# Patient Record
Sex: Male | Born: 1995 | Race: Black or African American | Hispanic: No | Marital: Married | Smoking: Never smoker
Health system: Southern US, Community
[De-identification: ages and names within clinical notes are randomized; demographics above are authoritative.]

## PROBLEM LIST (undated history)

## (undated) DIAGNOSIS — Z789 Other specified health status: Secondary | ICD-10-CM

---
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
## 2018-06-12 ENCOUNTER — Encounter (HOSPITAL_COMMUNITY): Payer: Self-pay | Admitting: Emergency Medicine

## 2018-06-12 ENCOUNTER — Ambulatory Visit (HOSPITAL_COMMUNITY)
Admission: EM | Admit: 2018-06-12 | Discharge: 2018-06-12 | Disposition: A | Discharge status: Home / Self Care | Payer: Federal, State, Local not specified - PPO | Attending: Internal Medicine | Admitting: Internal Medicine

## 2018-06-12 ENCOUNTER — Other Ambulatory Visit: Payer: Self-pay

## 2018-06-12 DIAGNOSIS — R42 Dizziness and giddiness: Secondary | ICD-10-CM

## 2018-06-12 DIAGNOSIS — R61 Generalized hyperhidrosis: Secondary | ICD-10-CM | POA: Diagnosis not present

## 2018-06-12 DIAGNOSIS — J029 Acute pharyngitis, unspecified: Secondary | ICD-10-CM | POA: Insufficient documentation

## 2018-06-12 LAB — POCT INFECTIOUS MONO SCREEN: Mono Screen: NEGATIVE

## 2018-06-12 LAB — POCT RAPID STREP A: Streptococcus, Group A Screen (Direct): NEGATIVE

## 2018-06-12 MED ORDER — CETIRIZINE HCL 10 MG PO CAPS: 10.0000 mg | ORAL_CAPSULE | Freq: Every day | ORAL | 0 refills | Status: DC

## 2018-06-12 MED ORDER — IBUPROFEN 800 MG PO TABS: 800.0000 mg | ORAL_TABLET | Freq: Three times a day (TID) | ORAL | 0 refills | Status: DC

## 2018-06-12 NOTE — Discharge Instructions (Signed)
Sore Throat  Your rapid strep tested Negative today as well as mono. We will send for a culture and call in about 2 days if results are positive. For now we will treat your sore throat as a virus with symptom management.   Please continue Tylenol or Ibuprofen for fever and pain. May try salt water gargles, cepacol lozenges, throat spray, or OTC cold relief medicine for throat discomfort. If you also have congestion take a daily anti-histamine like Zyrtec, Claritin, and a oral decongestant to help with post nasal drip that may be irritating your throat.   Stay hydrated and drink plenty of fluids to keep your throat coated relieve irritation.

## 2018-06-12 NOTE — ED Provider Notes (Signed)
[facility]    CSN: [id number] Arrival date & time: 06/12/18  1000     History   Chief Complaint Chief Complaint  Patient presents with  . Sore Throat    HPI Joshua Holmes is a 22 y.o. male there is no significant past medical history presenting today for evaluation of sore throat.  States he has had a sore throat for approximately 1 week, states that this is been mild until yesterday.  Sore throat worsened yesterday.  Has also noticed the taste of blood in the back of his throat.  He denies associated URI symptoms of congestion, rhinorrhea, coughing.  Is endorsing significant fatigue and decreased appetite.  He has been using Tylenol and throat spray with mild relief.  Denies nausea or vomiting.  He is unsure of fevers, but notes that he has had cold sweats/night sweats.  HPI  History reviewed. No pertinent past medical history.  There are no active problems to display for this patient.   History reviewed. No pertinent surgical history.     Home Medications    Prior to Admission medications   Medication Sig Start Date End Date Taking? Authorizing Provider  acetaminophen (TYLENOL) 500 MG tablet Take 1,000 mg by mouth every 6 (six) hours as needed for moderate pain, fever or headache.    Yes [provider]  Cetirizine HCl 10 MG CAPS Take 1 capsule (10 mg total) by mouth daily for 10 days. 06/12/18 06/22/18  Rilley Stash C, PA-C  ibuprofen (ADVIL,MOTRIN) 800 MG tablet Take 1 tablet (800 mg total) by mouth 3 (three) times daily. 06/12/18   Jovann Luse, Junius CreamerHallie C, PA-C    Family History Family History  Problem Relation Age of Onset  . Healthy Mother   . Healthy Father     Social History Social History   Tobacco Use  . Smoking status: Never Smoker  Substance Use Topics  . Alcohol use: Yes  . Drug use: Yes    Types: Marijuana     Allergies   Patient has no known allergies.   Review of Systems Review of Systems  Constitutional: Positive for  chills, fatigue and fever. Negative for activity change and appetite change.  HENT: Positive for sore throat. Negative for congestion, ear pain, rhinorrhea, sinus pressure and trouble swallowing.   Eyes: Negative for discharge and redness.  Respiratory: Negative for cough, chest tightness and shortness of breath.   Cardiovascular: Negative for chest pain.  Gastrointestinal: Negative for abdominal pain, diarrhea, nausea and vomiting.  Musculoskeletal: Negative for myalgias.  Skin: Negative for rash.  Neurological: Positive for light-headedness. Negative for dizziness and headaches.     Physical Exam Triage Vital Signs ED Triage Vitals  Enc Vitals Group     BP 06/12/18 1015 134/80     Pulse Rate 06/12/18 1015 82     Resp 06/12/18 1015 18     Temp 06/12/18 1015 98.1 F (36.7 C)     Temp Source 06/12/18 1015 Oral     SpO2 06/12/18 1015 99 %     Weight --      Height --      Head Circumference --      Peak Flow --      Pain Score 06/12/18 1013 8     Pain Loc --      Pain Edu? --      Excl. in GC? --    No data found.  Updated Vital Signs BP 134/80 (BP Location: Left Arm)  Pulse 82   Temp 98.1 F (36.7 C) (Oral)   Resp 18   SpO2 99%   Visual Acuity Right Eye Distance:   Left Eye Distance:   Bilateral Distance:    Right Eye Near:   Left Eye Near:    Bilateral Near:     Physical Exam  Constitutional: He appears well-developed and well-nourished.  HENT:  Head: Normocephalic and atraumatic.  Bilateral ears without tenderness to palpation of external auricle, tragus and mastoid, EAC's without erythema or swelling, TM's with good bony landmarks and cone of light. Non erythematous.  Oral mucosa pink and moist, mild tonsillar enlargement with erythema, slight white film appearance of her tonsils. Posterior pharynx patent and nonerythematous, no uvula deviation or swelling. Normal phonation.   Eyes: Conjunctivae are normal.  Neck: Neck supple.  Cardiovascular: Normal  rate and regular rhythm.  No murmur heard. Pulmonary/Chest: Effort normal and breath sounds normal. No respiratory distress.  Breathing comfortably at rest, CTABL, no wheezing, rales or other adventitious sounds auscultated  Abdominal: Soft. There is no tenderness.  Musculoskeletal: He exhibits no edema.  Neurological: He is alert.  Skin: Skin is warm and dry.  Psychiatric: He has a normal mood and affect.  Nursing note and vitals reviewed.    UC Treatments / Results  Labs (all labs ordered are listed, but only abnormal results are displayed) Labs Reviewed  CULTURE, GROUP A STREP [facility])  POCT RAPID STREP A  POCT INFECTIOUS MONO SCREEN    EKG None  Radiology No results found.  Procedures Procedures (including critical care time)  Medications Ordered in UC Medications - No data to display  Initial Impression / Assessment and Plan / UC Course  I have reviewed the triage vital signs and the nursing notes.  Pertinent labs & imaging results that were available during my care of the patient were reviewed by me and considered in my medical decision making (see chart for details).     Strep test negative, mono negative.  Likely viral versus postnasal drainage.  Discussed OTC measures to further control symptoms.Discussed strict return precautions. Patient verbalized understanding and is agreeable with plan.  Final Clinical Impressions(s) / UC Diagnoses   Final diagnoses:  Sore throat     Discharge Instructions     Sore Throat  Your rapid strep tested Negative today as well as mono. We will send for a culture and call in about 2 days if results are positive. For now we will treat your sore throat as a virus with symptom management.   Please continue Tylenol or Ibuprofen for fever and pain. May try salt water gargles, cepacol lozenges, throat spray, or OTC cold relief medicine for throat discomfort. If you also have congestion take a daily anti-histamine like Zyrtec,  Claritin, and a oral decongestant to help with post nasal drip that may be irritating your throat.   Stay hydrated and drink plenty of fluids to keep your throat coated relieve irritation.     ED Prescriptions    Medication Sig Dispense Auth. Provider   ibuprofen (ADVIL,MOTRIN) 800 MG tablet Take 1 tablet (800 mg total) by mouth 3 (three) times daily. 21 tablet Lin Hackmann C, PA-C   Cetirizine HCl 10 MG CAPS Take 1 capsule (10 mg total) by mouth daily for 10 days. 15 capsule Lakeishia Truluck C, PA-C     Controlled Substance Prescriptions [place] Controlled Substance Registry consulted? Not Applicable   Lew Dawes, [place] 06/12/18 1121

## 2018-06-12 NOTE — ED Triage Notes (Signed)
Sore throat x 1 week.  Patient says he tastes blood and spits bits of blood when brushing his teeth.  Patient having cold sweats.

## 2018-06-14 LAB — CULTURE, GROUP A STREP (THRC)

## 2019-06-28 ENCOUNTER — Other Ambulatory Visit: Payer: Self-pay

## 2019-06-28 DIAGNOSIS — Z20822 Contact with and (suspected) exposure to covid-19: Secondary | ICD-10-CM

## 2019-06-29 ENCOUNTER — Other Ambulatory Visit: Payer: Self-pay

## 2019-06-29 ENCOUNTER — Ambulatory Visit (HOSPITAL_COMMUNITY)
Admission: EM | Admit: 2019-06-29 | Discharge: 2019-06-29 | Disposition: A | Discharge status: Home / Self Care | Payer: Federal, State, Local not specified - PPO | Attending: Emergency Medicine | Admitting: Emergency Medicine

## 2019-06-29 ENCOUNTER — Encounter (HOSPITAL_COMMUNITY): Payer: Self-pay

## 2019-06-29 DIAGNOSIS — R509 Fever, unspecified: Secondary | ICD-10-CM | POA: Diagnosis present

## 2019-06-29 DIAGNOSIS — R112 Nausea with vomiting, unspecified: Secondary | ICD-10-CM | POA: Diagnosis present

## 2019-06-29 DIAGNOSIS — Z20828 Contact with and (suspected) exposure to other viral communicable diseases: Secondary | ICD-10-CM | POA: Insufficient documentation

## 2019-06-29 DIAGNOSIS — R1010 Upper abdominal pain, unspecified: Secondary | ICD-10-CM

## 2019-06-29 DIAGNOSIS — R1084 Generalized abdominal pain: Secondary | ICD-10-CM | POA: Diagnosis present

## 2019-06-29 DIAGNOSIS — R05 Cough: Secondary | ICD-10-CM | POA: Insufficient documentation

## 2019-06-29 DIAGNOSIS — R059 Cough, unspecified: Secondary | ICD-10-CM

## 2019-06-29 MED ORDER — TRAMADOL HCL 50 MG PO TABS: 50.0000 mg | ORAL_TABLET | Freq: Four times a day (QID) | ORAL | 0 refills | Status: AC | PRN

## 2019-06-29 MED ORDER — ONDANSETRON 4 MG PO TBDP
ORAL_TABLET | ORAL | Status: AC
  Filled 2019-06-29: qty 2

## 2019-06-29 MED ORDER — ACETAMINOPHEN 325 MG PO TABS
650.0000 mg | ORAL_TABLET | Freq: Once | ORAL | Status: AC
  Administered 2019-06-29: 650 mg via ORAL

## 2019-06-29 MED ORDER — ACETAMINOPHEN 325 MG PO TABS
ORAL_TABLET | ORAL | Status: AC
  Filled 2019-06-29: qty 2

## 2019-06-29 MED ORDER — ONDANSETRON 4 MG PO TBDP
8.0000 mg | ORAL_TABLET | Freq: Once | ORAL | Status: AC
  Administered 2019-06-29: 18:00:00 8 mg via ORAL

## 2019-06-29 MED ORDER — ONDANSETRON 8 MG PO TBDP: 8.0000 mg | ORAL_TABLET | Freq: Three times a day (TID) | ORAL | 0 refills | Status: DC | PRN

## 2019-06-29 NOTE — ED Notes (Addendum)
Called for room, no answer

## 2019-06-29 NOTE — Discharge Instructions (Signed)
Your COVID-19 testing is pending.  It is also possible that you could have a viral stomach infection or possibly appendicitis.  We will use supportive care including the antinausea medicine and pain medicine, tramadol.  Please continue to use Tylenol for fever and aches at a dose of 500 mg once every 6 hours.  If your pain worsens especially over the right lower side of your belly then please report to the emergency room immediately as this could be a sign of appendicitis.  Otherwise quarantine at home and we will keep you posted on your test results.

## 2019-06-29 NOTE — ED Triage Notes (Signed)
Pt states she has been feeling sick with nausea and abdominal pains.x 3 days or more.

## 2019-06-29 NOTE — ED Provider Notes (Signed)
MRN: [id number] DOB: 07-Nov-1996  Subjective:   Joshua Holmes is a 23 y.o. male presenting for 4 day history of acute onset worsening mostly intermittent upper abdominal pain that is a burning type sensation. Has also had decreased appetite. Has tried APAP without much relief. No COVID contacts to best of his knowledge. Denies smoking cigarettes.  He is not currently taking any medications and has no known food or drug allergies.  Denies past medical and surgical history.   Review of Systems  Constitutional: Positive for fever. Negative for malaise/fatigue.  HENT: Negative for congestion, ear pain, sinus pain and sore throat.   Eyes: Negative for blurred vision, double vision, discharge and redness.  Respiratory: Positive for cough. Negative for hemoptysis, shortness of breath and wheezing.   Cardiovascular: Negative for chest pain.  Gastrointestinal: Positive for abdominal pain, nausea and vomiting. Negative for blood in stool, constipation and diarrhea.  Genitourinary: Negative for dysuria, flank pain and hematuria.  Musculoskeletal: Positive for myalgias.  Skin: Negative for rash.  Neurological: Negative for dizziness, weakness and headaches.  Psychiatric/Behavioral: Negative for depression and substance abuse.    Objective:   Vitals: BP (!) 141/77 (BP Location: Right Arm)   Pulse (!) 114   Temp (!) 102 F (38.9 C)   Resp 18   Wt 16 lb (7.258 kg)   SpO2 100%   BMI 2.17 kg/m   Pulse was 104 on recheck by PA-Lissandro Dilorenzo.  Physical Exam Constitutional:      General: He is not in acute distress.    Appearance: Normal appearance. He is well-developed and normal weight. He is not ill-appearing, toxic-appearing or diaphoretic.  HENT:     Head: Normocephalic and atraumatic.     Right Ear: External ear normal.     Left Ear: External ear normal.     Nose: Nose normal.     Mouth/Throat:     Mouth: Mucous membranes are moist.     Pharynx: Oropharynx is clear.  Eyes:     General: No  scleral icterus.    Extraocular Movements: Extraocular movements intact.     Pupils: Pupils are equal, round, and reactive to light.  Cardiovascular:     Rate and Rhythm: Normal rate and regular rhythm.     Heart sounds: Normal heart sounds. No murmur. No friction rub. No gallop.   Pulmonary:     Effort: Pulmonary effort is normal. No respiratory distress.     Breath sounds: Normal breath sounds. No stridor. No wheezing, rhonchi or rales.  Abdominal:     General: Bowel sounds are normal. There is no distension.     Palpations: Abdomen is soft. There is no mass.     Tenderness: There is generalized abdominal tenderness and tenderness in the right lower quadrant, epigastric area and periumbilical area. There is no right CVA tenderness, left CVA tenderness, guarding or rebound.     Hernia: No hernia is present.  Skin:    General: Skin is warm and dry.  Neurological:     Mental Status: He is alert and oriented to person, place, and time.  Psychiatric:        Mood and Affect: Mood normal.        Behavior: Behavior normal.        Thought Content: Thought content normal.     Assessment and Plan :   1. Pain of upper abdomen   2. Fever, unspecified   3. Cough   4. Nausea and vomiting, intractability of vomiting not  specified, unspecified vomiting type   5. Generalized abdominal pain     Labs pending, counseled patient on differential which includes COVID-19, viral gastroenteritis, appendicitis.  Recommended patient use supportive care and counseled extensively on warning signs and symptoms of developing appendicitis.  Strict ER precautions. Counseled patient on potential for adverse effects with medications prescribed today, patient verbalized understanding.     Wallis BambergMani, Cherise Fedder, [place] 06/29/19 1753

## 2019-06-30 ENCOUNTER — Emergency Department (HOSPITAL_COMMUNITY): Payer: Federal, State, Local not specified - PPO

## 2019-06-30 ENCOUNTER — Other Ambulatory Visit: Payer: Self-pay

## 2019-06-30 ENCOUNTER — Encounter (HOSPITAL_COMMUNITY): Payer: Self-pay

## 2019-06-30 ENCOUNTER — Emergency Department (HOSPITAL_COMMUNITY)
Admission: EM | Admit: 2019-06-30 | Discharge: 2019-06-30 | Disposition: A | Discharge status: Home / Self Care | Payer: Federal, State, Local not specified - PPO | Attending: Emergency Medicine | Admitting: Emergency Medicine

## 2019-06-30 DIAGNOSIS — R1031 Right lower quadrant pain: Secondary | ICD-10-CM | POA: Diagnosis present

## 2019-06-30 LAB — CBC WITH DIFFERENTIAL/PLATELET
Abs Immature Granulocytes: 0.07 10*3/uL (ref 0.00–0.07)
Basophils Absolute: 0.1 10*3/uL (ref 0.0–0.1)
Basophils Relative: 0 %
Eosinophils Absolute: 0 10*3/uL (ref 0.0–0.5)
Eosinophils Relative: 0 %
HCT: 43.3 % (ref 39.0–52.0)
Hemoglobin: 14.8 g/dL (ref 13.0–17.0)
Immature Granulocytes: 0 %
Lymphocytes Relative: 11 %
Lymphs Abs: 1.7 10*3/uL (ref 0.7–4.0)
MCH: 30.3 pg (ref 26.0–34.0)
MCHC: 34.2 g/dL (ref 30.0–36.0)
MCV: 88.7 fL (ref 80.0–100.0)
Monocytes Absolute: 2.3 10*3/uL — ABNORMAL HIGH (ref 0.1–1.0)
Monocytes Relative: 14 %
Neutro Abs: 11.8 10*3/uL — ABNORMAL HIGH (ref 1.7–7.7)
Neutrophils Relative %: 75 %
Platelets: 171 10*3/uL (ref 150–400)
RBC: 4.88 MIL/uL (ref 4.22–5.81)
RDW: 13 % (ref 11.5–15.5)
WBC: 15.9 10*3/uL — ABNORMAL HIGH (ref 4.0–10.5)
nRBC: 0 % (ref 0.0–0.2)

## 2019-06-30 LAB — COMPREHENSIVE METABOLIC PANEL
ALT: 16 U/L (ref 0–44)
AST: 18 U/L (ref 15–41)
Albumin: 3.6 g/dL (ref 3.5–5.0)
Alkaline Phosphatase: 77 U/L (ref 38–126)
Anion gap: 13 (ref 5–15)
BUN: 10 mg/dL (ref 6–20)
CO2: 24 mmol/L (ref 22–32)
Calcium: 9.1 mg/dL (ref 8.9–10.3)
Chloride: 96 mmol/L — ABNORMAL LOW (ref 98–111)
Creatinine, Ser: 0.73 mg/dL (ref 0.61–1.24)
GFR calc Af Amer: >60 mL/min (ref >60)
GFR calc non Af Amer: >60 mL/min (ref >60)
Glucose, Bld: 125 mg/dL — ABNORMAL HIGH (ref 70–99)
Potassium: 3.4 mmol/L — ABNORMAL LOW (ref 3.5–5.1)
Sodium: 133 mmol/L — ABNORMAL LOW (ref 135–145)
Total Bilirubin: 1.4 mg/dL — ABNORMAL HIGH (ref 0.3–1.2)
Total Protein: 8.1 g/dL (ref 6.5–8.1)

## 2019-06-30 LAB — NOVEL CORONAVIRUS, NAA: SARS-CoV-2, NAA: NOT DETECTED

## 2019-06-30 LAB — LIPASE, BLOOD: Lipase: 22 U/L (ref 11–51)

## 2019-06-30 MED ORDER — SODIUM CHLORIDE (PF) 0.9 % IJ SOLN
INTRAMUSCULAR | Status: AC
  Filled 2019-06-30: qty 50

## 2019-06-30 MED ORDER — MORPHINE SULFATE (PF) 4 MG/ML IV SOLN
4.0000 mg | Freq: Once | INTRAVENOUS | Status: AC
  Administered 2019-06-30: 4 mg via INTRAVENOUS
  Filled 2019-06-30: qty 1

## 2019-06-30 MED ORDER — SODIUM CHLORIDE 0.9 % IV BOLUS
1000.0000 mL | Freq: Once | INTRAVENOUS | Status: AC
  Administered 2019-06-30: 1000 mL via INTRAVENOUS

## 2019-06-30 MED ORDER — ONDANSETRON HCL 4 MG/2ML IJ SOLN: 4.0000 mg | Freq: Once | INTRAMUSCULAR | Status: DC

## 2019-06-30 MED ORDER — PROMETHAZINE HCL 25 MG RE SUPP: 25.0000 mg | Freq: Four times a day (QID) | RECTAL | 0 refills | Status: DC | PRN

## 2019-06-30 MED ORDER — IOHEXOL 300 MG/ML  SOLN
100.0000 mL | Freq: Once | INTRAMUSCULAR | Status: AC | PRN
  Administered 2019-06-30: 100 mL via INTRAVENOUS

## 2019-06-30 MED ORDER — ONDANSETRON HCL 4 MG/2ML IJ SOLN
4.0000 mg | Freq: Once | INTRAMUSCULAR | Status: AC
  Administered 2019-06-30: 16:00:00 4 mg via INTRAVENOUS
  Filled 2019-06-30: qty 2

## 2019-06-30 NOTE — Discharge Instructions (Signed)
Try a bland diet.  Bananas rice applesauce and toast were typical.  Please return for worsening abdominal pain inability to eat or drink.  Use the suppositories as needed for nausea and vomiting.

## 2019-06-30 NOTE — ED Triage Notes (Signed)
Patient c/o RLQ abdominal pain and nausea x 4 days ago that is worsening.

## 2019-06-30 NOTE — ED Provider Notes (Signed)
[facility]EMERGENCY DEPT Provider Note   CSN: [id number] Arrival date & time: 06/30/19  1315    History   Chief Complaint Chief Complaint  Patient presents with  . Abdominal Pain  . Emesis    HPI Joshua Holmes is a 23 y.o. male.     23 yo M with a cc of RLQ abdominal pain.  Going on for the past 4 days.  Pain coming and going.  Worsening now with fever. Nausea and vomiting.  No diarrhea or constipation.  No history of prior abdominal surgery.  No penile or testicular pain.    The history is provided by the patient.  Abdominal Pain Pain location:  RLQ Pain quality: sharp and shooting   Pain radiates to:  Does not radiate Pain severity:  Moderate Onset quality:  Gradual Duration:  4 days Timing:  Constant Progression:  Worsening Chronicity:  New Relieved by:  Nothing Worsened by:  Nothing Ineffective treatments:  None tried Associated symptoms: vomiting   Associated symptoms: no chest pain, no chills, no diarrhea, no fever and no shortness of breath   Emesis Associated symptoms: abdominal pain   Associated symptoms: no arthralgias, no chills, no diarrhea, no fever, no headaches and no myalgias     History reviewed. No pertinent past medical history.  There are no active problems to display for this patient.   History reviewed. No pertinent surgical history.      Home Medications    Prior to Admission medications   Medication Sig Start Date End Date Taking? Authorizing Provider  acetaminophen (TYLENOL) 500 MG tablet Take 1,000 mg by mouth every 6 (six) hours as needed for moderate pain, fever or headache.    Yes [provider]  ondansetron (ZOFRAN-ODT) 8 MG disintegrating tablet Take 1 tablet (8 mg total) by mouth every 8 (eight) hours as needed for nausea or vomiting. 06/29/19  Yes Wallis BambergMani, Mario, PA-C  traMADol (ULTRAM) 50 MG tablet Take 1 tablet (50 mg total) by mouth every 6 (six) hours as needed for up to 7 days for severe  pain. 06/29/19 07/06/19 Yes Wallis BambergMani, Mario, PA-C  Cetirizine HCl 10 MG CAPS Take 1 capsule (10 mg total) by mouth daily for 10 days. Patient not taking: Reported on 06/30/2019 06/12/18 06/22/18  Wieters, Hallie C, PA-C  ibuprofen (ADVIL,MOTRIN) 800 MG tablet Take 1 tablet (800 mg total) by mouth 3 (three) times daily. Patient not taking: Reported on 06/30/2019 06/12/18   Wieters, Fran LowesHallie C, PA-C  promethazine (PHENERGAN) 25 MG suppository Place 1 suppository (25 mg total) rectally every 6 (six) hours as needed for nausea or vomiting. 06/30/19   Melene PlanFloyd, Rasheida Broden, DO    Family History Family History  Problem Relation Age of Onset  . Healthy Mother   . Healthy Father     Social History Social History   Tobacco Use  . Smoking status: Never Smoker  . Smokeless tobacco: Never Used  Substance Use Topics  . Alcohol use: Yes  . Drug use: Yes    Types: Marijuana     Allergies   Patient has no known allergies.   Review of Systems Review of Systems  Constitutional: Negative for chills and fever.  HENT: Negative for congestion and facial swelling.   Eyes: Negative for discharge and visual disturbance.  Respiratory: Negative for shortness of breath.   Cardiovascular: Negative for chest pain and palpitations.  Gastrointestinal: Positive for abdominal pain and vomiting. Negative for diarrhea.  Musculoskeletal: Negative for arthralgias and myalgias.  Skin: Negative for color change and rash.  Neurological: Negative for tremors, syncope and headaches.  Psychiatric/Behavioral: Negative for confusion and dysphoric mood.     Physical Exam Updated Vital Signs BP 125/64   Pulse 97   Temp 99.5 F (37.5 C) (Oral)   Resp 16   Ht 6' (1.829 m)   Wt 70.7 kg   SpO2 100%   BMI 21.13 kg/m   Physical Exam Vitals signs and nursing note reviewed.  Constitutional:      Appearance: He is well-developed.  HENT:     Head: Normocephalic and atraumatic.  Eyes:     Pupils: Pupils are equal, round, and reactive  to light.  Neck:     Musculoskeletal: Normal range of motion and neck supple.     Vascular: No JVD.  Cardiovascular:     Rate and Rhythm: Normal rate and regular rhythm.     Heart sounds: No murmur. No friction rub. No gallop.   Pulmonary:     Effort: No respiratory distress.     Breath sounds: No wheezing.  Abdominal:     General: There is no distension.     Tenderness: There is abdominal tenderness. There is no guarding or rebound. Positive signs include Rovsing's sign and McBurney's sign.  Musculoskeletal: Normal range of motion.  Skin:    Coloration: Skin is not pale.     Findings: No rash.  Neurological:     Mental Status: He is alert and oriented to person, place, and time.  Psychiatric:        Behavior: Behavior normal.      ED Treatments / Results  Labs (all labs ordered are listed, but only abnormal results are displayed) Labs Reviewed  CBC WITH DIFFERENTIAL/PLATELET - Abnormal; Notable for the following components:      Result Value   WBC 15.9 (*)    Neutro Abs 11.8 (*)    Monocytes Absolute 2.3 (*)    All other components within normal limits  COMPREHENSIVE METABOLIC PANEL - Abnormal; Notable for the following components:   Sodium 133 (*)    Potassium 3.4 (*)    Chloride 96 (*)    Glucose, Bld 125 (*)    Total Bilirubin 1.4 (*)    All other components within normal limits  LIPASE, BLOOD    EKG None  Radiology Ct Abdomen Pelvis W Contrast  Result Date: 06/30/2019 CLINICAL DATA:  Right lower quadrant abdominal pain and nausea for 4 days. Symptoms have been progressing. Abdominal pain, appendicitis suspected. EXAM: CT ABDOMEN AND PELVIS WITH CONTRAST TECHNIQUE: Multidetector CT imaging of the abdomen and pelvis was performed using the standard protocol following bolus administration of intravenous contrast. CONTRAST:  100mL OMNIPAQUE IOHEXOL 300 MG/ML  SOLN COMPARISON:  None. FINDINGS: Lower chest: Lung bases are clear without focal nodule, mass, or airspace  disease. The heart size is normal. No significant pleural or pericardial effusion is present. Hepatobiliary: No focal liver abnormality is seen. No gallstones, gallbladder wall thickening, a 5 mm hypodense lesion near the dome of the liver likely represents a simple cyst. Other focal hepatic lesion is present. There is no gallstone or gallbladder wall thickening. There is no biliary dilation. Pancreas: Unremarkable. No pancreatic ductal dilatation or surrounding inflammatory changes. Spleen: Normal in size without focal abnormality. Adrenals/Urinary Tract: Adrenal glands are within normal limits. Contrast is noted in the calices. This may be due to an early contrast injection. There is no stone or mass lesion. Ureters are within normal limits. The  urinary bladder is normal. Stomach/Bowel: Stomach and duodenum are within normal limits. Small bowel is unremarkable. Terminal ileum is unremarkable. Air-filled appendix is present posteriorly and a transverse plane. The ascending and transverse colon are within normal limits. Descending and sigmoid colon are normal. Vascular/Lymphatic: No significant vascular findings are present. No enlarged abdominal or pelvic lymph nodes. Reproductive: Prostate is unremarkable. Other: No abdominal wall hernia or abnormality. No abdominopelvic ascites. Musculoskeletal: Vertebral body heights and alignment are maintained. No focal lytic or blastic lesions are present. Hips are located and within normal limits. IMPRESSION: 1. Normal CT of the abdomen pelvis. No acute or focal lesion to explain the patient's pain. 2. Normal CT appearance of the appendix. Electronically Signed   By: San Morelle M.D.   On: 06/30/2019 18:10    Procedures Procedures (including critical care time)  Medications Ordered in ED Medications  sodium chloride 0.9 % bolus 1,000 mL (0 mLs Intravenous Stopped 06/30/19 1845)  ondansetron (ZOFRAN) injection 4 mg (4 mg Intravenous Given 06/30/19 1556)   morphine 4 MG/ML injection 4 mg (4 mg Intravenous Given 06/30/19 1618)  iohexol (OMNIPAQUE) 300 MG/ML solution 100 mL (100 mLs Intravenous Contrast Given 06/30/19 1743)     Initial Impression / Assessment and Plan / ED Course  I have reviewed the triage vital signs and the nursing notes.  Pertinent labs & imaging results that were available during my care of the patient were reviewed by me and considered in my medical decision making (see chart for details).        23 yo M with a cc of RLQ abdominal pain.  Going on for 4-5 days, coming and going now with fever.  Concern for appendicitis, pain and fluid meds, CT.   CT negative. Feeling better on reassessment.  Tolerating PO.   10:51 PM:  I have discussed the diagnosis/risks/treatment options with the patient and believe the pt to be eligible for discharge home to follow-up with PCP. We also discussed returning to the ED immediately if new or worsening sx occur. We discussed the sx which are most concerning (e.g., sudden worsening pain, fever, inability to tolerate by mouth) that necessitate immediate return. Medications administered to the patient during their visit and any new prescriptions provided to the patient are listed below.  Medications given during this visit Medications  sodium chloride 0.9 % bolus 1,000 mL (0 mLs Intravenous Stopped 06/30/19 1845)  ondansetron (ZOFRAN) injection 4 mg (4 mg Intravenous Given 06/30/19 1556)  morphine 4 MG/ML injection 4 mg (4 mg Intravenous Given 06/30/19 1618)  iohexol (OMNIPAQUE) 300 MG/ML solution 100 mL (100 mLs Intravenous Contrast Given 06/30/19 1743)     The patient appears reasonably screen and/or stabilized for discharge and I doubt any other medical condition or other [facility] requiring further screening, evaluation, or treatment in the ED at this time prior to discharge.    Final Clinical Impressions(s) / ED Diagnoses   Final diagnoses:  RLQ abdominal pain    ED Discharge Orders          Ordered    promethazine (PHENERGAN) 25 MG suppository  Every 6 hours PRN     06/30/19 1832           Deno Etienne, DO 06/30/19 2251

## 2019-07-03 LAB — NOVEL CORONAVIRUS, NAA (HOSP ORDER, SEND-OUT TO REF LAB; TAT 18-24 HRS): SARS-CoV-2, NAA: NOT DETECTED

## 2019-10-11 ENCOUNTER — Encounter (HOSPITAL_COMMUNITY): Payer: Self-pay

## 2019-10-11 ENCOUNTER — Ambulatory Visit (HOSPITAL_COMMUNITY)
Admission: EM | Admit: 2019-10-11 | Discharge: 2019-10-11 | Disposition: A | Discharge status: Home / Self Care | Payer: Self-pay | Attending: Family Medicine | Admitting: Family Medicine

## 2019-10-11 ENCOUNTER — Other Ambulatory Visit: Payer: Self-pay

## 2019-10-11 DIAGNOSIS — Z20828 Contact with and (suspected) exposure to other viral communicable diseases: Secondary | ICD-10-CM | POA: Insufficient documentation

## 2019-10-11 DIAGNOSIS — J029 Acute pharyngitis, unspecified: Secondary | ICD-10-CM | POA: Insufficient documentation

## 2019-10-11 DIAGNOSIS — Z20822 Contact with and (suspected) exposure to covid-19: Secondary | ICD-10-CM

## 2019-10-11 DIAGNOSIS — R05 Cough: Secondary | ICD-10-CM | POA: Insufficient documentation

## 2019-10-11 DIAGNOSIS — R059 Cough, unspecified: Secondary | ICD-10-CM

## 2019-10-11 NOTE — ED Provider Notes (Signed)
[facility]    CSN: [id number] Arrival date & time: 10/11/19  1820      History   Chief Complaint Chief Complaint  Patient presents with  . Sore Throat  . Headache    HPI Joshua Holmes is a 23 y.o. male.   HPI  Patient had fever, chills, body aches, coughing, some sore throat for about a week.  He felt a bit better so tried to go to work yesterday.  Today he felt sick again so he was sent home.  His employer request coronavirus testing.  He still has some coughing and congestion.  Mild shortness of breath.  He does not have a history of asthma or cigarette smoking.  Still has a runny nose and sore throat with some headache.  States he has a backache.  No known exposure to coronavirus.  No fever since last week  History reviewed. No pertinent past medical history.  There are no active problems to display for this patient.   History reviewed. No pertinent surgical history.     Home Medications    Prior to Admission medications   Medication Sig Start Date End Date Taking? Authorizing Provider  acetaminophen (TYLENOL) 500 MG tablet Take 1,000 mg by mouth every 6 (six) hours as needed for moderate pain, fever or headache.    Yes [provider]  Cetirizine HCl 10 MG CAPS Take 1 capsule (10 mg total) by mouth daily for 10 days. Patient not taking: Reported on 06/30/2019 06/12/18 10/11/19  Wieters, Junius Creamer, PA-C  promethazine (PHENERGAN) 25 MG suppository Place 1 suppository (25 mg total) rectally every 6 (six) hours as needed for nausea or vomiting. 06/30/19 10/11/19  Melene Plan, DO    Family History Family History  Problem Relation Age of Onset  . Healthy Mother   . Healthy Father     Social History Social History   Tobacco Use  . Smoking status: Never Smoker  . Smokeless tobacco: Never Used  Substance Use Topics  . Alcohol use: Yes    Comment: weekly  . Drug use: Yes    Types: Marijuana     Allergies   Patient has no known allergies.    Review of Systems Review of Systems  Constitutional: Negative for chills and fever.  HENT: Positive for congestion and sore throat. Negative for ear pain.   Eyes: Negative for pain and visual disturbance.  Respiratory: Negative for cough and shortness of breath.   Cardiovascular: Negative for chest pain and palpitations.  Gastrointestinal: Negative for abdominal pain and vomiting.  Genitourinary: Negative for dysuria and hematuria.  Musculoskeletal: Positive for back pain. Negative for arthralgias.  Skin: Negative for color change and rash.  Neurological: Positive for headaches. Negative for seizures and syncope.  All other systems reviewed and are negative.    Physical Exam Triage Vital Signs ED Triage Vitals  Enc Vitals Group     BP 10/11/19 1913 114/83     Pulse Rate 10/11/19 1913 74     Resp 10/11/19 1913 17     Temp 10/11/19 1913 99.5 F (37.5 C)     Temp Source 10/11/19 1913 Oral     SpO2 10/11/19 1913 99 %     Weight --      Height --      Head Circumference --      Peak Flow --      Pain Score 10/11/19 1912 8     Pain Loc --  Pain Edu? --      Excl. in [place]? --    No data found.  Updated Vital Signs BP 114/83 (BP Location: Left Arm)   Pulse 74   Temp 99.5 F (37.5 C) (Oral)   Resp 17   SpO2 99%      Physical Exam Constitutional:      General: He is not in acute distress.    Appearance: He is well-developed and normal weight. He is not ill-appearing.  HENT:     Head: Normocephalic and atraumatic.     Right Ear: Tympanic membrane and ear canal normal.     Left Ear: Tympanic membrane normal.     Nose: Congestion present.     Mouth/Throat:     Pharynx: Posterior oropharyngeal erythema present.  Eyes:     Conjunctiva/sclera: Conjunctivae normal.     Pupils: Pupils are equal, round, and reactive to light.  Neck:     Musculoskeletal: Normal range of motion and neck supple.  Cardiovascular:     Rate and Rhythm: Normal rate and regular rhythm.      Heart sounds: Normal heart sounds.  Pulmonary:     Effort: Pulmonary effort is normal. No respiratory distress.     Breath sounds: Wheezing present.  Abdominal:     General: Abdomen is flat. There is no distension.     Palpations: Abdomen is soft.     Tenderness: There is no abdominal tenderness.  Musculoskeletal: Normal range of motion.  Lymphadenopathy:     Cervical: No cervical adenopathy.  Skin:    General: Skin is warm and dry.  Neurological:     General: No focal deficit present.     Mental Status: He is alert.  Psychiatric:        Mood and Affect: Mood normal.        Behavior: Behavior normal.      UC Treatments / Results  Labs (all labs ordered are listed, but only abnormal results are displayed) Labs Reviewed  NOVEL CORONAVIRUS, NAA (HOSP ORDER, SEND-OUT TO REF LAB; TAT 18-24 HRS)    EKG   Radiology No results found.  Procedures Procedures (including critical care time)  Medications Ordered in UC Medications - No data to display  Initial Impression / Assessment and Plan / UC Course  I have reviewed the triage vital signs and the nursing notes.  Pertinent labs & imaging results that were available during my care of the patient were reviewed by me and considered in my medical decision making (see chart for details).      Final Clinical Impressions(s) / UC Diagnoses   Final diagnoses:  Sore throat  Cough  Suspected COVID-19 virus infection     Discharge Instructions     You must quarantine at home until covid test is available Tylenol for pain or fever Drink plenty of fluids Sign up for my chart for test results    ED Prescriptions    None     PDMP not reviewed this encounter.   Raylene Everts, MD 10/11/19 [phone]

## 2019-10-11 NOTE — ED Triage Notes (Signed)
Patient presents to Urgent Care with complaints of headache, runny nose, and sore throat since 8 days ago. Patient reports he has been taking mucinex and tylenol at home.

## 2019-10-11 NOTE — Discharge Instructions (Signed)
You must quarantine at home until covid test is available Tylenol for pain or fever Drink plenty of fluids Sign up for my chart for test results

## 2019-10-13 LAB — NOVEL CORONAVIRUS, NAA (HOSP ORDER, SEND-OUT TO REF LAB; TAT 18-24 HRS): SARS-CoV-2, NAA: NOT DETECTED

## 2020-11-15 IMAGING — CT CT ABDOMEN AND PELVIS WITH CONTRAST
2 of 4 series · 15 of 46 positions shown, 17 images · IV contrast (ISOVUE)
Comparison: None.

CLINICAL DATA: Right lower quadrant abdominal pain and nausea for 4
days. Symptoms have been progressing. Abdominal pain, appendicitis
suspected.

EXAM:
CT ABDOMEN AND PELVIS WITH CONTRAST
TECHNIQUE: Multidetector CT imaging of the abdomen and pelvis was performed
using the standard protocol following bolus administration of
intravenous contrast.
CONTRAST:  100mL OMNIPAQUE IOHEXOL 300 MG/ML  SOLN

[Series 2: axial st · axial · 0.67mm/px · z∈[-501,-71]mm · 12 of 96 slices shown, 14 images]
[im 5/96  soft-tissue]
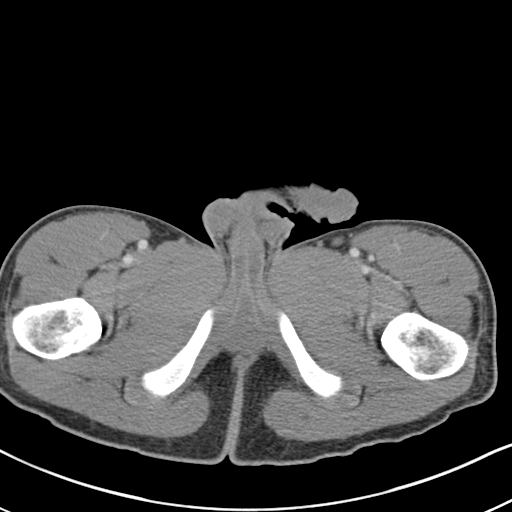
[im 5/96  bone]
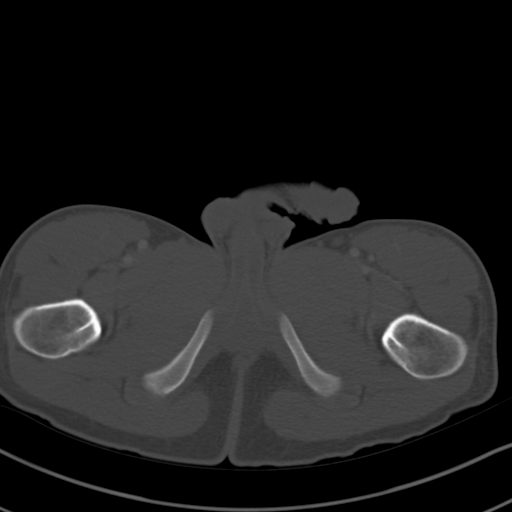
[im 14/96  soft-tissue]
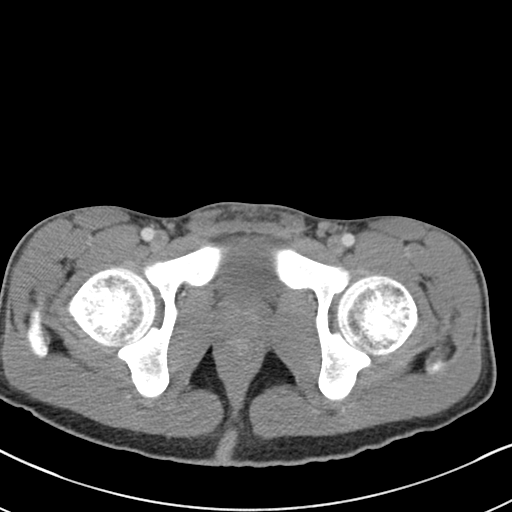
[im 23/96  soft-tissue]
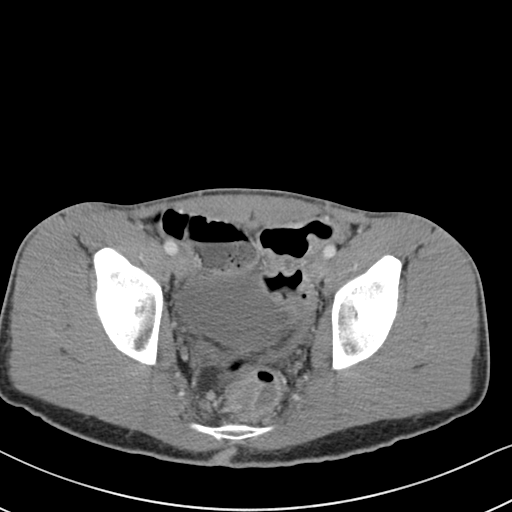
[im 28/96  soft-tissue]
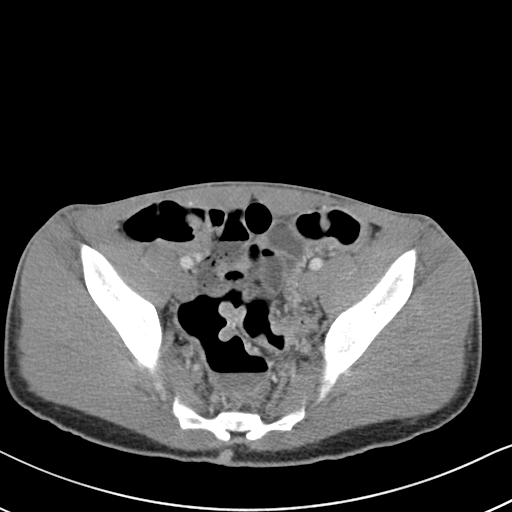
[im 37/96  soft-tissue]
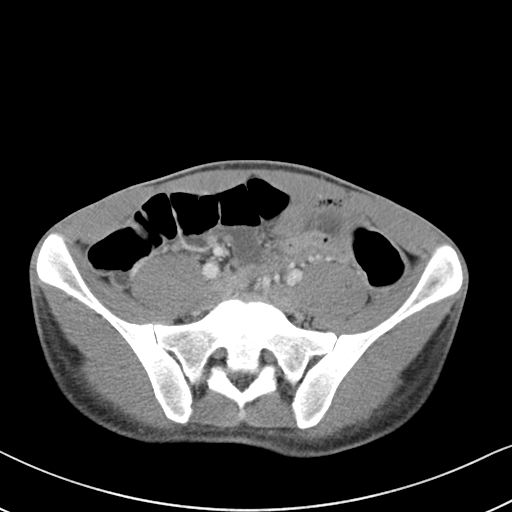
[im 46/96  soft-tissue]
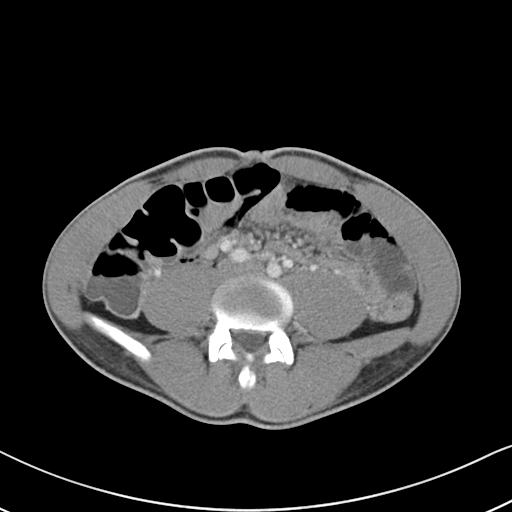
[im 50/96  soft-tissue]
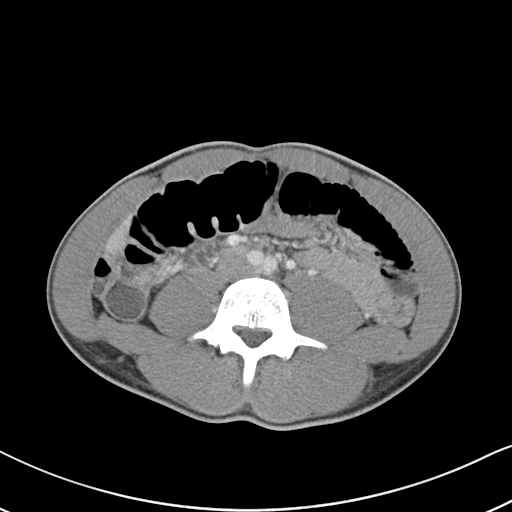
[im 59/96  soft-tissue]
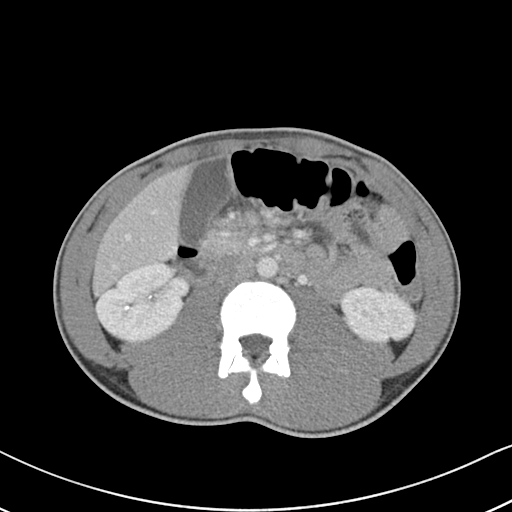
[im 68/96  soft-tissue]
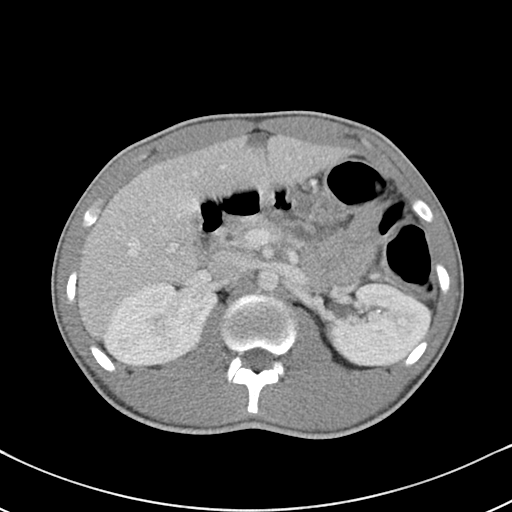
[im 68/96  bone]
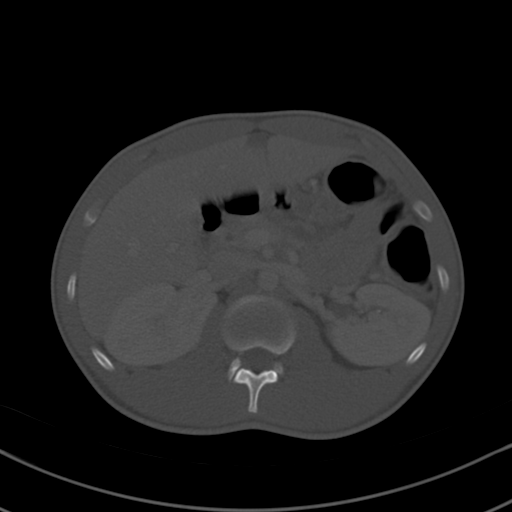
[im 73/96  soft-tissue]
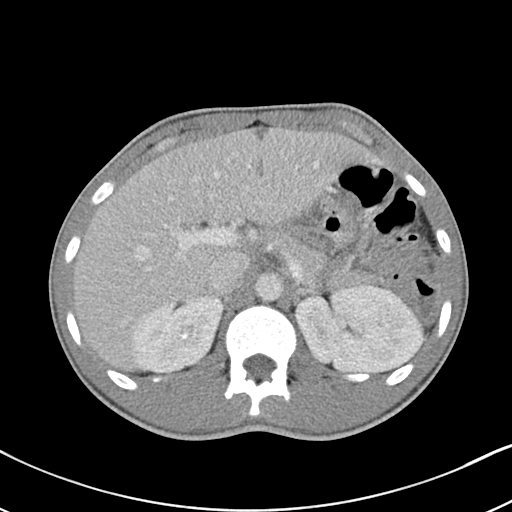
[im 82/96  soft-tissue]
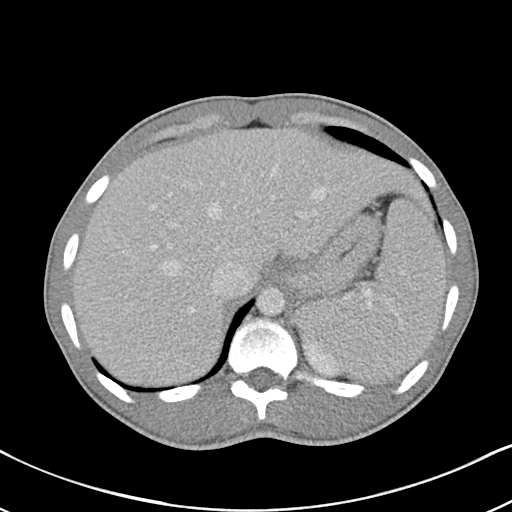
[im 91/96  soft-tissue]
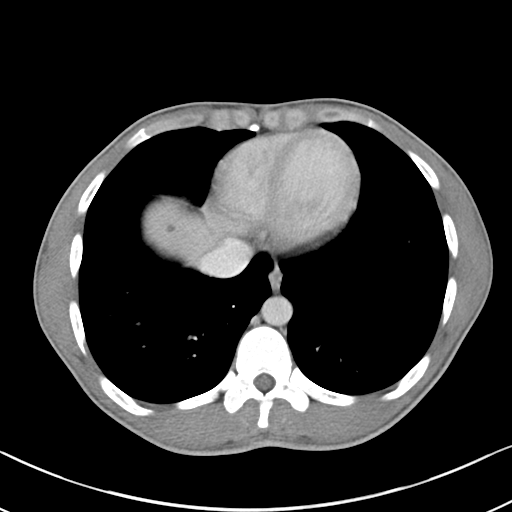

[Series 5: coronal st · coronal · 0.65mm/px · 3 of 124 slices shown]
[im 42/124  soft-tissue]
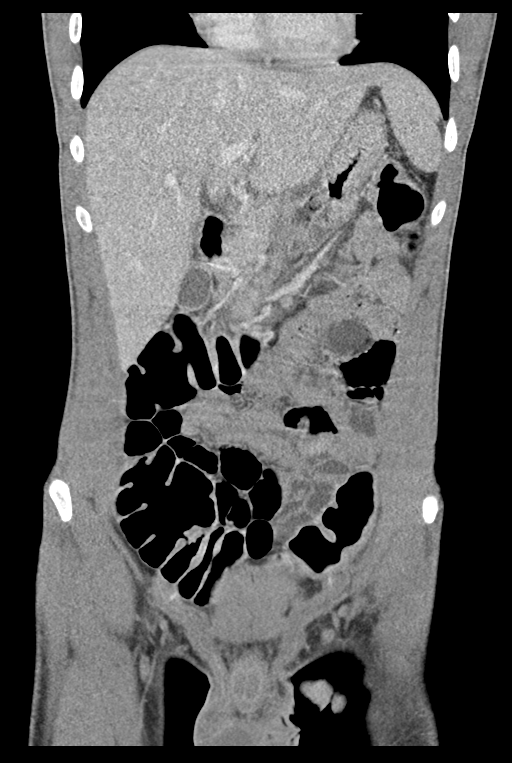
[im 55/124  soft-tissue]
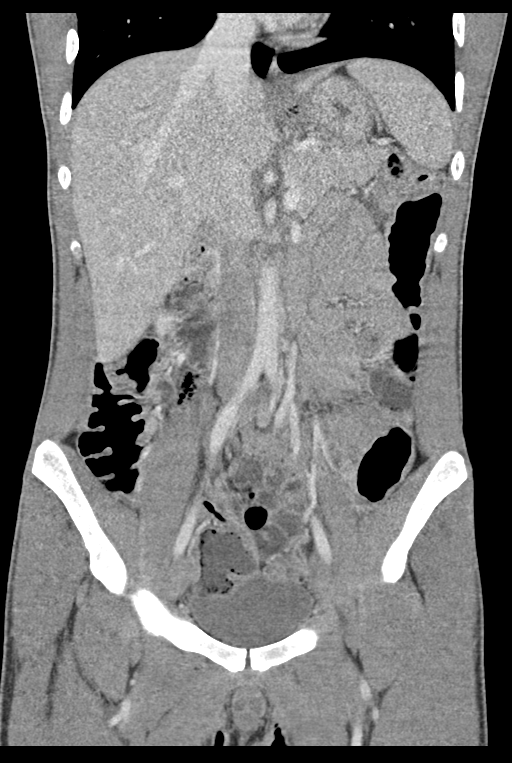
[im 69/124  soft-tissue]
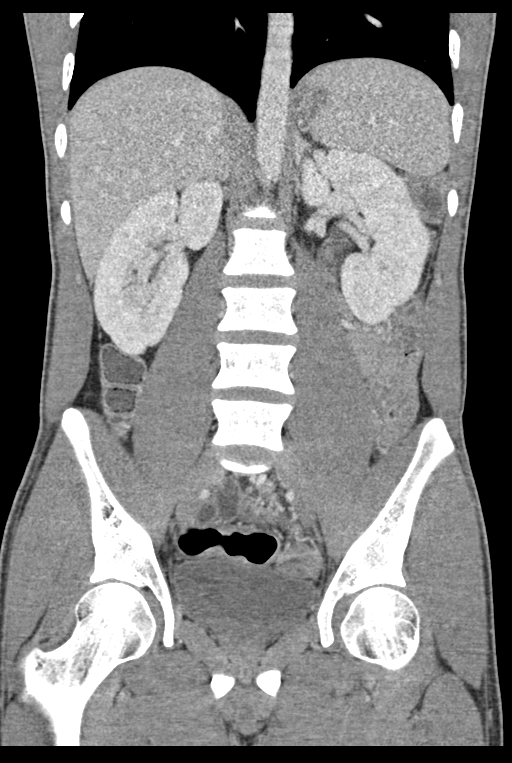

[15 of 46 positions shown; findings below may reference images not displayed]

FINDINGS: Lower chest: Lung bases are clear without focal nodule, mass, or
airspace disease. The heart size is normal. No significant pleural
or pericardial effusion is present.

Hepatobiliary: No focal liver abnormality is seen. No gallstones,
gallbladder wall thickening, a 5 mm hypodense lesion near the dome
of the liver likely represents a simple cyst. Other focal hepatic
lesion is present. There is no gallstone or gallbladder wall
thickening. There is no biliary dilation.

Pancreas: Unremarkable. No pancreatic ductal dilatation or
surrounding inflammatory changes.

Spleen: Normal in size without focal abnormality.

Adrenals/Urinary Tract: Adrenal glands are within normal limits.
Contrast is noted in the calices. This may be due to an early
contrast injection. There is no stone or mass lesion. Ureters are
within normal limits. The urinary bladder is normal.

Stomach/Bowel: Stomach and duodenum are within normal limits. Small
bowel is unremarkable. Terminal ileum is unremarkable. Air-filled
appendix is present posteriorly and a transverse plane. The
ascending and transverse colon are within normal limits. Descending
and sigmoid colon are normal.

Vascular/Lymphatic: No significant vascular findings are present. No
enlarged abdominal or pelvic lymph nodes.

Reproductive: Prostate is unremarkable.

Other: No abdominal wall hernia or abnormality. No abdominopelvic
ascites.

Musculoskeletal: Vertebral body heights and alignment are
maintained. No focal lytic or blastic lesions are present. Hips are
located and within normal limits.
IMPRESSION: 1. Normal CT of the abdomen pelvis. No acute or focal lesion to
explain the patient's pain.
2. Normal CT appearance of the appendix.

## 2021-02-25 ENCOUNTER — Emergency Department (HOSPITAL_COMMUNITY)
Admission: EM | Admit: 2021-02-25 | Discharge: 2021-02-25 | Disposition: A | Discharge status: Home / Self Care | Payer: 59 | Attending: Emergency Medicine | Admitting: Emergency Medicine

## 2021-02-25 ENCOUNTER — Encounter (HOSPITAL_COMMUNITY): Payer: Self-pay

## 2021-02-25 ENCOUNTER — Emergency Department (HOSPITAL_COMMUNITY): Payer: 59

## 2021-02-25 ENCOUNTER — Other Ambulatory Visit: Payer: Self-pay

## 2021-02-25 DIAGNOSIS — R630 Anorexia: Secondary | ICD-10-CM | POA: Diagnosis not present

## 2021-02-25 DIAGNOSIS — R1031 Right lower quadrant pain: Secondary | ICD-10-CM | POA: Diagnosis not present

## 2021-02-25 DIAGNOSIS — R109 Unspecified abdominal pain: Secondary | ICD-10-CM

## 2021-02-25 HISTORY — DX: Other specified health status: Z78.9

## 2021-02-25 LAB — URINALYSIS, ROUTINE W REFLEX MICROSCOPIC
Bacteria, UA: NONE SEEN
Bilirubin Urine: NEGATIVE
Glucose, UA: NEGATIVE mg/dL
Hgb urine dipstick: NEGATIVE
Ketones, ur: 80 mg/dL — AB
Leukocytes,Ua: NEGATIVE
Nitrite: NEGATIVE
Protein, ur: 100 mg/dL — AB
Specific Gravity, Urine: >1.046 — ABNORMAL HIGH (ref 1.005–1.030)
pH: 6 (ref 5.0–8.0)

## 2021-02-25 LAB — COMPREHENSIVE METABOLIC PANEL
ALT: 12 U/L (ref 0–44)
AST: 17 U/L (ref 15–41)
Albumin: 4.2 g/dL (ref 3.5–5.0)
Alkaline Phosphatase: 69 U/L (ref 38–126)
Anion gap: 11 (ref 5–15)
BUN: 7 mg/dL (ref 6–20)
CO2: 21 mmol/L — ABNORMAL LOW (ref 22–32)
Calcium: 9.3 mg/dL (ref 8.9–10.3)
Chloride: 99 mmol/L (ref 98–111)
Creatinine, Ser: 0.7 mg/dL (ref 0.61–1.24)
GFR, Estimated: >60 mL/min (ref >60)
Glucose, Bld: 139 mg/dL — ABNORMAL HIGH (ref 70–99)
Potassium: 3 mmol/L — ABNORMAL LOW (ref 3.5–5.1)
Sodium: 131 mmol/L — ABNORMAL LOW (ref 135–145)
Total Bilirubin: 2.3 mg/dL — ABNORMAL HIGH (ref 0.3–1.2)
Total Protein: 8.3 g/dL — ABNORMAL HIGH (ref 6.5–8.1)

## 2021-02-25 LAB — CBC
HCT: 45.7 % (ref 39.0–52.0)
Hemoglobin: 16.9 g/dL (ref 13.0–17.0)
MCH: 32 pg (ref 26.0–34.0)
MCHC: 37 g/dL — ABNORMAL HIGH (ref 30.0–36.0)
MCV: 86.6 fL (ref 80.0–100.0)
Platelets: 201 10*3/uL (ref 150–400)
RBC: 5.28 MIL/uL (ref 4.22–5.81)
RDW: 12.1 % (ref 11.5–15.5)
WBC: 8.1 10*3/uL (ref 4.0–10.5)
nRBC: 0 % (ref 0.0–0.2)

## 2021-02-25 LAB — LIPASE, BLOOD: Lipase: 21 U/L (ref 11–51)

## 2021-02-25 MED ORDER — MORPHINE SULFATE (PF) 4 MG/ML IV SOLN
4.0000 mg | Freq: Once | INTRAVENOUS | Status: AC
  Administered 2021-02-25: 4 mg via INTRAVENOUS
  Filled 2021-02-25: qty 1

## 2021-02-25 MED ORDER — KETOROLAC TROMETHAMINE 30 MG/ML IJ SOLN
30.0000 mg | Freq: Once | INTRAMUSCULAR | Status: AC
  Administered 2021-02-25: 30 mg via INTRAVENOUS
  Filled 2021-02-25: qty 1

## 2021-02-25 MED ORDER — ONDANSETRON HCL 4 MG/2ML IJ SOLN
4.0000 mg | Freq: Once | INTRAMUSCULAR | Status: AC
  Administered 2021-02-25: 4 mg via INTRAVENOUS
  Filled 2021-02-25: qty 2

## 2021-02-25 MED ORDER — ONDANSETRON 4 MG PO TBDP: 4.0000 mg | ORAL_TABLET | Freq: Three times a day (TID) | ORAL | 0 refills | Status: DC | PRN

## 2021-02-25 MED ORDER — DICYCLOMINE HCL 20 MG PO TABS: 20.0000 mg | ORAL_TABLET | Freq: Two times a day (BID) | ORAL | 0 refills | Status: DC

## 2021-02-25 MED ORDER — IOHEXOL 300 MG/ML  SOLN
75.0000 mL | Freq: Once | INTRAMUSCULAR | Status: AC | PRN
  Administered 2021-02-25: 75 mL via INTRAVENOUS

## 2021-02-25 MED ORDER — POTASSIUM CHLORIDE 10 MEQ/100ML IV SOLN
10.0000 meq | Freq: Once | INTRAVENOUS | Status: AC
  Administered 2021-02-25: 10 meq via INTRAVENOUS
  Filled 2021-02-25: qty 100

## 2021-02-25 NOTE — ED Provider Notes (Signed)
[facility] EMERGENCY DEPARTMENT Provider Note   CSN: [id number] Arrival date & time: 02/25/21  0410     History Chief Complaint  Patient presents with  . Abdominal Pain    Joshua Holmes is a 25 y.o. male who presents for evaluation of right lower quadrant abdominal pain that has been ongoing for last 4 days.  He states initially, 1 symptoms started 4 days ago, he felt like his abdomen is bloated.  He states that then it started progressing to a sharp pain in the right lower quadrant states it has been constant since then.  He has had some nausea and dry heaves.  He states he has had decreased appetite and has not really wanted to eat anything.  He denies any fevers.  He has not had any dysuria, hematuria, diarrhea.  He denies any chest pain, difficulty breathing.  He denies any pain in his testicles or penis.  He has never had any abdominal surgeries.  The history is provided by the patient.       Past Medical History:  Diagnosis Date  . Known health problems: none     There are no problems to display for this patient.   History reviewed. No pertinent surgical history.     Family History  Problem Relation Age of Onset  . Healthy Mother   . Healthy Father     Social History   Tobacco Use  . Smoking status: Never Smoker  . Smokeless tobacco: Never Used  Vaping Use  . Vaping Use: Never used  Substance Use Topics  . Alcohol use: Yes    Comment: weekly  . Drug use: Yes    Types: Marijuana    Home Medications Prior to Admission medications   Medication Sig Start Date End Date Taking? Authorizing Provider  dicyclomine (BENTYL) 20 MG tablet Take 1 tablet (20 mg total) by mouth 2 (two) times daily for 5 days. 02/25/21 03/02/21 Yes Maxwell Caul, PA-C  ondansetron (ZOFRAN ODT) 4 MG disintegrating tablet Take 1 tablet (4 mg total) by mouth every 8 (eight) hours as needed for nausea or vomiting. 02/25/21  Yes Maxwell Caul, PA-C  acetaminophen  (TYLENOL) 500 MG tablet Take 1,000 mg by mouth every 6 (six) hours as needed for moderate pain, fever or headache.     [provider]  Cetirizine HCl 10 MG CAPS Take 1 capsule (10 mg total) by mouth daily for 10 days. Patient not taking: Reported on 06/30/2019 06/12/18 10/11/19  Wieters, Junius Creamer, PA-C  promethazine (PHENERGAN) 25 MG suppository Place 1 suppository (25 mg total) rectally every 6 (six) hours as needed for nausea or vomiting. 06/30/19 10/11/19  Melene Plan, DO    Allergies    Patient has no known allergies.  Review of Systems   Review of Systems  Constitutional: Positive for appetite change. Negative for fever.  Respiratory: Negative for cough and shortness of breath.   Cardiovascular: Negative for chest pain.  Gastrointestinal: Positive for abdominal pain, nausea and vomiting.  Genitourinary: Negative for dysuria and hematuria.  Neurological: Negative for headaches.  All other systems reviewed and are negative.   Physical Exam Updated Vital Signs BP 123/75 (BP Location: Left Arm)   Pulse 60   Temp 98.3 F (36.8 C) (Oral)   Resp 18   Ht 6' (1.829 m)   Wt 77.1 kg   SpO2 100%   BMI 23.06 kg/m   Physical Exam Vitals and nursing note reviewed.  Constitutional:  Appearance: Normal appearance. He is well-developed.  HENT:     Head: Normocephalic and atraumatic.  Eyes:     General: Lids are normal.     Conjunctiva/sclera: Conjunctivae normal.     Pupils: Pupils are equal, round, and reactive to light.  Cardiovascular:     Rate and Rhythm: Normal rate and regular rhythm.     Pulses: Normal pulses.     Heart sounds: Normal heart sounds. No murmur heard. No friction rub. No gallop.   Pulmonary:     Effort: Pulmonary effort is normal.     Breath sounds: Normal breath sounds.     Comments: Lungs clear to auscultation bilaterally.  Symmetric chest rise.  No wheezing, rales, rhonchi. Abdominal:     Palpations: Abdomen is soft. Abdomen is not rigid.      Tenderness: There is abdominal tenderness. There is no right CVA tenderness, left CVA tenderness or guarding. Positive signs include McBurney's sign.     Comments: Abdomen soft, nondistended.  Tenderness palpation focally in the right lower quadrant, particularly at McBurney's point.  No rebounding.  Negative Rovsing sign.  No CVA tenderness noted bilaterally.  No rigidity, guarding.  Genitourinary:    Comments: The exam was performed with a chaperone present Tresa Endo(kelly, RN).  No hernia noted bilaterally.  No tenderness palpation in bilateral testicles.  No overlying warmth, erythema, edema. Musculoskeletal:        General: Normal range of motion.     Cervical back: Full passive range of motion without pain.  Skin:    General: Skin is warm and dry.     Capillary Refill: Capillary refill takes less than 2 seconds.  Neurological:     Mental Status: He is alert and oriented to person, place, and time.  Psychiatric:        Speech: Speech normal.     ED Results / Procedures / Treatments   Labs (all labs ordered are listed, but only abnormal results are displayed) Labs Reviewed  COMPREHENSIVE METABOLIC PANEL - Abnormal; Notable for the following components:      Result Value   Sodium 131 (*)    Potassium 3.0 (*)    CO2 21 (*)    Glucose, Bld 139 (*)    Total Protein 8.3 (*)    Total Bilirubin 2.3 (*)    All other components within normal limits  CBC - Abnormal; Notable for the following components:   MCHC 37.0 (*)    All other components within normal limits  URINALYSIS, ROUTINE W REFLEX MICROSCOPIC - Abnormal; Notable for the following components:   Color, Urine AMBER (*)    Specific Gravity, Urine >1.046 (*)    Ketones, ur 80 (*)    Protein, ur 100 (*)    All other components within normal limits  LIPASE, BLOOD    EKG None  Radiology CT Abdomen Pelvis W Contrast  Result Date: 02/25/2021 CLINICAL DATA:  Right lower quadrant pain for 3 days. EXAM: CT ABDOMEN AND PELVIS WITH  CONTRAST TECHNIQUE: Multidetector CT imaging of the abdomen and pelvis was performed using the standard protocol following bolus administration of intravenous contrast. CONTRAST:  75mL OMNIPAQUE IOHEXOL 300 MG/ML  SOLN COMPARISON:  06/30/2019 FINDINGS: Lower chest: Clear lung bases. Normal heart size without pericardial or pleural effusion. Hepatobiliary: Focal steatosis adjacent the falciform ligament. Normal gallbladder, without biliary ductal dilatation. Pancreas: Normal, without mass or ductal dilatation. Spleen: Normal in size, without focal abnormality. Adrenals/Urinary Tract: Normal adrenal glands. Right renal interpolar too small  to characterize lesion. Normal left kidney. No hydronephrosis. Normal urinary bladder. Stomach/Bowel: Normal stomach, without wall thickening. Normal colon and terminal ileum. Normal appendix, including on coronal images 49 through 54. Normal small bowel. Vascular/Lymphatic: Normal caliber of the aorta and branch vessels. No abdominopelvic adenopathy. Reproductive: Normal prostate. Other: No significant free fluid.  No free intraperitoneal air. Musculoskeletal: No acute osseous abnormality. IMPRESSION: No acute process or explanation for right lower quadrant pain. Normal appendix. Electronically Signed   By: Jeronimo Greaves M.D.   On: 02/25/2021 12:05    Procedures Procedures   Medications Ordered in ED Medications  ondansetron (ZOFRAN) injection 4 mg (4 mg Intravenous Given 02/25/21 1054)  morphine 4 MG/ML injection 4 mg (4 mg Intravenous Given 02/25/21 1055)  potassium chloride 10 mEq in 100 mL IVPB (0 mEq Intravenous Stopped 02/25/21 1319)  iohexol (OMNIPAQUE) 300 MG/ML solution 75 mL (75 mLs Intravenous Contrast Given 02/25/21 1147)  ketorolac (TORADOL) 30 MG/ML injection 30 mg (30 mg Intravenous Given 02/25/21 1358)    ED Course  I have reviewed the triage vital signs and the nursing notes.  Pertinent labs & imaging results that were available during my care of the  patient were reviewed by me and considered in my medical decision making (see chart for details).    MDM Rules/Calculators/A&P                          25 year old male who presents for evaluation of 4 days of abdominal pain, nausea, decreased appetite.  On initial arrival, he is afebrile, appears uncomfortable in no acute distress.  Vital signs are stable.  On exam, he is focal tenderness noted to the right lower quadrant, particular McBurney's point.  No CVA tenderness.  Doubt kidney stone/GU process.  Concern for viral process, infectious process versus appendicitis.  Given that he is focally tender in the right lower quadrant, will obtain imaging.  Lipase unremarkable.  CBC shows no leukocytosis.  Hemoglobin stable at 16.9.  CMP shows potassium of 3.0, BUN of 7, creatinine of 0.70.  UA negative for any infectious etiology.  CT abd/pelvis shows no acute abnormality. Normal appendix.   Repeat evaluation.  He does report improvement in pain but still having some mild tenderness.  Chaperone exam revealed no evidence of hernia.  No testicular pain, warmth, erythema, edema.  Will give additional analgesics.  Reevaluation.  Patient is hemodynamically stable.  He has been able tolerate p.o. without any difficulty.  Repeat evaluation shows some improvement in tenderness and he does report that he has improvement in his pain.  At this time, unclear etiology of his abdominal pain.  We discussed that this could be a viral GI process.  At this time, patient is tolerating p.o., has some improvement in pain and is hemodynamically stable.  No indication that he meets criteria for admission.  We will plan to treat symptomatically.  Patient instructed to closely monitor symptoms and if he has any worsening or returning pain, he is to return to the emergency department immediately.  Portions of this note were generated with Scientist, clinical (histocompatibility and immunogenetics). Dictation errors may occur despite best attempts at  proofreading.   Final Clinical Impression(s) / ED Diagnoses Final diagnoses:  Abdominal pain, unspecified abdominal location    Rx / DC Orders ED Discharge Orders         Ordered    dicyclomine (BENTYL) 20 MG tablet  2 times daily  02/25/21 1425    ondansetron (ZOFRAN ODT) 4 MG disintegrating tablet  Every 8 hours PRN        02/25/21 1425           Maxwell Caul, PA-C 02/25/21 1514    Melene Plan, DO 02/26/21 0710

## 2021-02-25 NOTE — ED Notes (Signed)
Tolerated crackers and sprite well

## 2021-02-25 NOTE — ED Triage Notes (Signed)
Pt reports that he has been having pain to right lower abdomen all weekend. Hurts with bending and standing up.Reports nausea and vomiting. Denies fever and diarrhea.

## 2021-02-25 NOTE — Discharge Instructions (Addendum)
As we discussed, your work-up today was reassuring.  Your CT scan did not show any evidence of inflammation around your appendix.  Take bentyl for pain.   Take zofran for nausea.    As we discussed, closely monitor your symptoms.  If you have any worsening abdominal pain, persistent vomiting, fevers, return emergency department immediately.

## 2021-02-25 NOTE — ED Notes (Signed)
Pt given sprite and saltines 

## 2021-02-26 ENCOUNTER — Other Ambulatory Visit: Payer: Self-pay

## 2021-02-26 ENCOUNTER — Emergency Department (HOSPITAL_COMMUNITY)
Admission: EM | Admit: 2021-02-26 | Discharge: 2021-02-26 | Disposition: A | Discharge status: Home / Self Care | Payer: 59 | Attending: Emergency Medicine | Admitting: Emergency Medicine

## 2021-02-26 DIAGNOSIS — D72829 Elevated white blood cell count, unspecified: Secondary | ICD-10-CM | POA: Diagnosis not present

## 2021-02-26 DIAGNOSIS — K2901 Acute gastritis with bleeding: Secondary | ICD-10-CM | POA: Diagnosis not present

## 2021-02-26 DIAGNOSIS — R109 Unspecified abdominal pain: Secondary | ICD-10-CM | POA: Diagnosis present

## 2021-02-26 LAB — CBC
HCT: 42.5 % (ref 39.0–52.0)
Hemoglobin: 15.6 g/dL (ref 13.0–17.0)
MCH: 31.8 pg (ref 26.0–34.0)
MCHC: 36.7 g/dL — ABNORMAL HIGH (ref 30.0–36.0)
MCV: 86.7 fL (ref 80.0–100.0)
Platelets: 218 10*3/uL (ref 150–400)
RBC: 4.9 MIL/uL (ref 4.22–5.81)
RDW: 11.9 % (ref 11.5–15.5)
WBC: 11.7 10*3/uL — ABNORMAL HIGH (ref 4.0–10.5)
nRBC: 0 % (ref 0.0–0.2)

## 2021-02-26 LAB — URINALYSIS, ROUTINE W REFLEX MICROSCOPIC
Bacteria, UA: NONE SEEN
Bilirubin Urine: NEGATIVE
Glucose, UA: NEGATIVE mg/dL
Ketones, ur: 20 mg/dL — AB
Leukocytes,Ua: NEGATIVE
Nitrite: NEGATIVE
Protein, ur: 30 mg/dL — AB
Specific Gravity, Urine: 1.028 (ref 1.005–1.030)
pH: 6 (ref 5.0–8.0)

## 2021-02-26 LAB — COMPREHENSIVE METABOLIC PANEL
ALT: 13 U/L (ref 0–44)
AST: 17 U/L (ref 15–41)
Albumin: 3.6 g/dL (ref 3.5–5.0)
Alkaline Phosphatase: 62 U/L (ref 38–126)
Anion gap: 12 (ref 5–15)
BUN: 22 mg/dL — ABNORMAL HIGH (ref 6–20)
CO2: 23 mmol/L (ref 22–32)
Calcium: 9.1 mg/dL (ref 8.9–10.3)
Chloride: 99 mmol/L (ref 98–111)
Creatinine, Ser: 0.71 mg/dL (ref 0.61–1.24)
GFR, Estimated: >60 mL/min (ref >60)
Glucose, Bld: 118 mg/dL — ABNORMAL HIGH (ref 70–99)
Potassium: 3.4 mmol/L — ABNORMAL LOW (ref 3.5–5.1)
Sodium: 134 mmol/L — ABNORMAL LOW (ref 135–145)
Total Bilirubin: 1.5 mg/dL — ABNORMAL HIGH (ref 0.3–1.2)
Total Protein: 8 g/dL (ref 6.5–8.1)

## 2021-02-26 LAB — LIPASE, BLOOD: Lipase: 26 U/L (ref 11–51)

## 2021-02-26 MED ORDER — ONDANSETRON 4 MG PO TBDP: 4.0000 mg | ORAL_TABLET | Freq: Three times a day (TID) | ORAL | 0 refills | Status: DC | PRN

## 2021-02-26 MED ORDER — ACETAMINOPHEN 500 MG PO TABS
1000.0000 mg | ORAL_TABLET | Freq: Once | ORAL | Status: AC
  Administered 2021-02-26: 1000 mg via ORAL
  Filled 2021-02-26: qty 2

## 2021-02-26 MED ORDER — ONDANSETRON 4 MG PO TBDP: 8.0000 mg | ORAL_TABLET | Freq: Once | ORAL | Status: DC

## 2021-02-26 MED ORDER — PANTOPRAZOLE SODIUM 40 MG PO TBEC: 40.0000 mg | DELAYED_RELEASE_TABLET | Freq: Once | ORAL | Status: DC

## 2021-02-26 MED ORDER — PANTOPRAZOLE SODIUM 20 MG PO TBEC: 20.0000 mg | DELAYED_RELEASE_TABLET | Freq: Every day | ORAL | 0 refills | Status: DC

## 2021-02-26 NOTE — ED Triage Notes (Signed)
Pt seen here yesterday for abdominal pain and d/c with rx for bentyl and zofran. Taking both and feeling better this morning, however threw up significant amount of blood this morning after swallowing pills so is back for further eval.

## 2021-02-26 NOTE — Discharge Instructions (Addendum)
Please continue taking the antinausea medicine called Zofran, 1 tablet every 6 hours  I have also prescribed a medicine called pantoprazole which is an antinausea medicine, you may take this once a day, take it at the same time every day.  Please avoid using marijuana as this can cause vomiting and abdominal cramping.  Return to the emergency department for severe or worsening symptoms.  I would recommend that you follow-up with your family doctor within 1 week, you may need to see a GI doctor if you are not better within 2 weeks

## 2021-02-26 NOTE — ED Provider Notes (Signed)
[facility] EMERGENCY DEPARTMENT Provider Note   CSN: [id number] Arrival date & time: 02/26/21  1618     History Chief Complaint  Patient presents with  . Abdominal Pain  . Hematemesis    Joshua Holmes is a 25 y.o. male.  HPI   This patient is a 25 year old male, he denies any chronic medical problems, he does endorse drinking on the weekends sometimes heavily, he endorses tobacco use, he endorses marijuana use daily.  The patient does not have any surgical history, he had had about 4 days of abdominal discomfort and showed up yesterday for evaluation and had a negative CT scan, lab work was rather unremarkable and he was discharged home with supportive medications.  Despite that this morning he woke up and had an episode of vomiting which was just clear liquid then had another episode of vomiting with some black chunky material.  Other than that the patient is similar with mild abdominal discomfort.  He does take occasional NSAIDs.  No prior known history of peptic ulcer disease.  Symptoms are intermittent, really no different than yesterday.  Past Medical History:  Diagnosis Date  . Known health problems: none     There are no problems to display for this patient.   No past surgical history on file.     Family History  Problem Relation Age of Onset  . Healthy Mother   . Healthy Father     Social History   Tobacco Use  . Smoking status: Never Smoker  . Smokeless tobacco: Never Used  Vaping Use  . Vaping Use: Never used  Substance Use Topics  . Alcohol use: Yes    Comment: weekly  . Drug use: Yes    Types: Marijuana    Home Medications Prior to Admission medications   Medication Sig Start Date End Date Taking? Authorizing Provider  ondansetron (ZOFRAN ODT) 4 MG disintegrating tablet Take 1 tablet (4 mg total) by mouth every 8 (eight) hours as needed for nausea or vomiting. 02/26/21  Yes Eber Hong, MD  pantoprazole (PROTONIX) 20 MG  tablet Take 1 tablet (20 mg total) by mouth daily. 02/26/21  Yes Eber Hong, MD  acetaminophen (TYLENOL) 500 MG tablet Take 1,000 mg by mouth every 6 (six) hours as needed for moderate pain, fever or headache.     [provider]  dicyclomine (BENTYL) 20 MG tablet Take 1 tablet (20 mg total) by mouth 2 (two) times daily for 5 days. 02/25/21 03/02/21  Maxwell Caul, PA-C  Cetirizine HCl 10 MG CAPS Take 1 capsule (10 mg total) by mouth daily for 10 days. Patient not taking: Reported on 06/30/2019 06/12/18 10/11/19  Wieters, Junius Creamer, PA-C  promethazine (PHENERGAN) 25 MG suppository Place 1 suppository (25 mg total) rectally every 6 (six) hours as needed for nausea or vomiting. 06/30/19 10/11/19  Melene Plan, DO    Allergies    Patient has no known allergies.  Review of Systems   Review of Systems  All other systems reviewed and are negative.   Physical Exam Updated Vital Signs BP 138/84 (BP Location: Right Arm)   Pulse 96   Temp 98.9 F (37.2 C) (Oral)   Resp (!) 22   SpO2 99%   Physical Exam Vitals and nursing note reviewed.  Constitutional:      General: He is not in acute distress.    Appearance: He is well-developed.  HENT:     Head: Normocephalic and atraumatic.  Mouth/Throat:     Pharynx: No oropharyngeal exudate.  Eyes:     General: No scleral icterus.       Right eye: No discharge.        Left eye: No discharge.     Conjunctiva/sclera: Conjunctivae normal.     Pupils: Pupils are equal, round, and reactive to light.  Neck:     Thyroid: No thyromegaly.     Vascular: No JVD.  Cardiovascular:     Rate and Rhythm: Normal rate and regular rhythm.     Heart sounds: Normal heart sounds. No murmur heard. No friction rub. No gallop.   Pulmonary:     Effort: Pulmonary effort is normal. No respiratory distress.     Breath sounds: Normal breath sounds. No wheezing or rales.  Abdominal:     General: Bowel sounds are normal. There is no distension.      Palpations: Abdomen is soft. There is no mass.     Tenderness: There is abdominal tenderness.  Musculoskeletal:        General: No tenderness. Normal range of motion.     Cervical back: Normal range of motion and neck supple.  Lymphadenopathy:     Cervical: No cervical adenopathy.  Skin:    General: Skin is warm and dry.     Findings: No erythema or rash.  Neurological:     General: No focal deficit present.     Mental Status: He is alert.     Coordination: Coordination normal.  Psychiatric:        Behavior: Behavior normal.     ED Results / Procedures / Treatments   Labs (all labs ordered are listed, but only abnormal results are displayed) Labs Reviewed  COMPREHENSIVE METABOLIC PANEL - Abnormal; Notable for the following components:      Result Value   Sodium 134 (*)    Potassium 3.4 (*)    Glucose, Bld 118 (*)    BUN 22 (*)    Total Bilirubin 1.5 (*)    All other components within normal limits  CBC - Abnormal; Notable for the following components:   WBC 11.7 (*)    MCHC 36.7 (*)    All other components within normal limits  URINALYSIS, ROUTINE W REFLEX MICROSCOPIC - Abnormal; Notable for the following components:   Hgb urine dipstick SMALL (*)    Ketones, ur 20 (*)    Protein, ur 30 (*)    All other components within normal limits  LIPASE, BLOOD    EKG None  Radiology CT Abdomen Pelvis W Contrast  Result Date: 02/25/2021 CLINICAL DATA:  Right lower quadrant pain for 3 days. EXAM: CT ABDOMEN AND PELVIS WITH CONTRAST TECHNIQUE: Multidetector CT imaging of the abdomen and pelvis was performed using the standard protocol following bolus administration of intravenous contrast. CONTRAST:  73mL OMNIPAQUE IOHEXOL 300 MG/ML  SOLN COMPARISON:  06/30/2019 FINDINGS: Lower chest: Clear lung bases. Normal heart size without pericardial or pleural effusion. Hepatobiliary: Focal steatosis adjacent the falciform ligament. Normal gallbladder, without biliary ductal dilatation.  Pancreas: Normal, without mass or ductal dilatation. Spleen: Normal in size, without focal abnormality. Adrenals/Urinary Tract: Normal adrenal glands. Right renal interpolar too small to characterize lesion. Normal left kidney. No hydronephrosis. Normal urinary bladder. Stomach/Bowel: Normal stomach, without wall thickening. Normal colon and terminal ileum. Normal appendix, including on coronal images 49 through 54. Normal small bowel. Vascular/Lymphatic: Normal caliber of the aorta and branch vessels. No abdominopelvic adenopathy. Reproductive: Normal prostate. Other: No significant free  fluid.  No free intraperitoneal air. Musculoskeletal: No acute osseous abnormality. IMPRESSION: No acute process or explanation for right lower quadrant pain. Normal appendix. Electronically Signed   By: Jeronimo Greaves M.D.   On: 02/25/2021 12:05    Procedures Procedures   Medications Ordered in ED Medications  pantoprazole (PROTONIX) EC tablet 40 mg (has no administration in time range)  ondansetron (ZOFRAN-ODT) disintegrating tablet 8 mg (has no administration in time range)  acetaminophen (TYLENOL) tablet 1,000 mg (1,000 mg Oral Given 02/26/21 1708)    ED Course  I have reviewed the triage vital signs and the nursing notes.  Pertinent labs & imaging results that were available during my care of the patient were reviewed by me and considered in my medical decision making (see chart for details).  Clinical Course as of 02/26/21 2027  Tue Feb 26, 2021  2021 Calcium: 9.1 [BM]    Clinical Course User Index [BM] Eber Hong, MD   MDM Rules/Calculators/A&P                          Mild leukocytosis, otherwise labs are unremarkable, lipase normal, exam is consistent with gastritis, this may be alcoholic gastritis, it may be related to cyclic vomiting related to the cannabis hyperemesis.  I counseled the patient against marijuana use.  We will start on proton pump inhibitor, will get a refill on antiemetics,  instructed to take a clear liquid diet for couple of days, patient agreeable, mother at the bedside also agreeable.  Patient stable for discharge.  Final Clinical Impression(s) / ED Diagnoses Final diagnoses:  Acute gastritis with hemorrhage, unspecified gastritis type    Rx / DC Orders ED Discharge Orders         Ordered    pantoprazole (PROTONIX) 20 MG tablet  Daily        02/26/21 2026    ondansetron (ZOFRAN ODT) 4 MG disintegrating tablet  Every 8 hours PRN        02/26/21 2026           Eber Hong, MD 02/26/21 2027

## 2022-04-03 ENCOUNTER — Ambulatory Visit: Payer: 59 | Admitting: Family Medicine

## 2022-04-03 NOTE — Progress Notes (Deleted)
   Subjective:    Patient ID: Joshua Holmes, male    DOB: 07-14-1996, 26 y.o.   MRN: [id number]   CC: Establish care  HPI:  Jasani K Knapper is a very pleasant 26 y.o. male who presents today to establish care.  Initial concerns:***  Past medical history:***. Recently seen in the emergency department for acute gastritis and hematemesis.  Labs showed a mild leukocytosis but otherwise were normal including lipase and exam was consistent with gastritis with possibility of alcoholic gastritis.  Was started on PPI and given refill for antiemetics.  Past surgical history:***  Current medications:***  Family history:***  Social history:***  ROS: pertinent noted in the HPI   Objective:  There were no vitals taken for this visit.  Vitals and nursing note reviewed  General: NAD, pleasant, able to participate in exam Cardiac: RRR, S1 S2 present. normal heart sounds, no murmurs. Respiratory: CTAB, normal effort, No wheezes, rales or rhonchi Abdomen: Bowel sounds present, non-tender, non-distended, no hepatosplenomegaly Extremities: no edema or cyanosis. Skin: warm and dry, no rashes noted Neuro: alert, no obvious focal deficits Psych: Normal affect and mood   Assessment & Plan:    No problem-specific Assessment & Plan notes found for this encounter.  We will screen for hepatitis C and HIV per recommendations***.  Lurline Del, [facility] PGY-3

## 2022-04-14 NOTE — Patient Instructions (Addendum)
It was nice seeing you today! ? ?Take Miralax once a day for constipation. You can increase it to twice a day if stools are still hard. ? ?Look into getting in with a therapist. ? ?Stay well, ?Littie Deeds, MD ?[facility] ?[phone] ? ?-- ? ?Therapy and Counseling Resources ?Most providers on this list will take Medicaid. Patients with commercial insurance or Medicare should contact their insurance company to get a list of in network providers. ? ?[facility] (spanish speaking therapist available)(habla espanol)(take medicare and medicaid)  ?[address], [place], [place] [address], [place] ?[email or web address] ?[phone] ? ?[facility]ing ?[address] [place], [place] [address] ?[phone] ? ?[facility]  ? [address] [place], [place] [address]      [phone] ? ?[facility] (spanish available) ?[address]  [place], [place] [address] ?[phone] ? ?[facility] (take medicaid and medicare) ?[address]  [place], [place] [address]       [phone]    ? ?[facility] (virtual only) ?[phone] ? ?[facility] ?[address] [place], [place] [phone] ? ?[facility]:  [address] [place] [phone] ? ?[facility]:  ?Coralie Carpen [phone] ? ?[facility] (under & uninsured) ?[address]   [place] [phone]    [email or web address]   ? ?[facility] ?[address]  [place]    [phone] ? ?[facility] ?[facility] [address]     Phone:  [phone]     [facility]-  [address]  [phone]  ? ?[facility] ?[address] [place], [place] [phone] ? ?[facility]: [address] [place], [place], [place]  [phone]  ? ?[facility] (Spanish therapist) [email or web address].[email or web address]/  ?[address] [place]  [address]   [place] [address]    [phone]   ? ?[facility]   ?[address],  [place], [place]  [phone]  ? ?[facility]  ?[address] [place]  [phone] ? ?[facility]  ?[address] [place], [place]        [phone] ? ?Open Access/Walk In Clinic under & uninsured ? ?[facility]  ?[address] [place], [place] ?Front Line [phone] ?Crisis [phone] ? ?[facility] [address],  ?(Spanish)   [address], [place]: [phone]) 8:30 - 12; 1 - 2:30 ? ?[facility] [employer],  ?[address] [place]    [phone]):8:30 - 12; 2 - 3PM ? ?[facility] [employer],  ?[address]  [place]; [phone]):   Mon - Fri 8 AM - 5 PM ? ?[facility] ?[address] [place]  MWF 12:30 to 3:00 or call to schedule an appointment  [phone] ? ?Specific Provider options ?[facility]  https://www.psychologytoday.com/us ?click on find a therapist  ?enter your zip code ?left side and select or tailor a therapist for your specific need.  ? ?[facility] Provider Directory ?[email or web address]/  (Medicaid)   Follow all drop down to find a provider ? ?Social Support program ?[facility] ?[phone] I7437963 or [employer].pl ?[address], [place],  Recovery support and educational  ? ?24- Hour Availability:  ? ?[facility]  ?[address] [place], [place] ?Front Line [phone] ?Crisis [phone] ? ?[facility] [phone] ? ?[employer] [facility]  [phone]  ? ?[facility] [employer]  [phone] (after hours) ? ?[facility]/Mobile Crisis   [phone] ? ?[place] National Suicide Hotline  (714) [phone] Len Childs) ? ?Call 911 or go to  emergency room ? ?[employer]  [phone]);  [place]  ? ?[facility]  ?[phone]); [place], [place], [place], [place], [place], [place], [place]  ?-- ? ?If you are feeling suicidal or depression symptoms worsen please immediately go to:  ? ?If you are thinking about harming yourself or having thoughts of suicide, or if you know someone who is, seek help right away. ?If you are in crisis, make sure you are not left alone.  ?If someone else is in crisis, make sure he/she/they is not left alone ? ?Call 988 OR 1-800-273-TALK ? ?24 Hour Availability for Walk-IN services  ?[facility]  ?[address] [place], [place] ?Front Line [phone] ?Crisis [phone] ? ? ? ?Other crisis resources: ? ?[facility] the [employer] ?(Domestic Violence, Rape & Victim Assistance ?[phone] ? ?[facility] [employer]    ?(ONLY from 8am-4pm)    ?[phone] ? ?[facility] Mobile Crisis Unit (24/7)   ?[phone] ? ?[place] National Suicide Hotline   ?[phone] Len Childs)  ? ?-- ?Make sure to check out at the front desk before you leave today. ? ?Please arrive at least 15 minutes prior to your scheduled appointments. ? ?If you had blood work today, I will send you a MyChart message or a letter if results are normal. Otherwise, I will give you a call. ? ?If you had a referral placed, they will call you to set up an appointment. Please give [place] a call if you don't hear back in the next 2 weeks. ? ?If you need additional refills before your next appointment, please call your pharmacy first.  ?

## 2022-04-14 NOTE — Progress Notes (Signed)
? ? ?SUBJECTIVE:  ? ?CHIEF COMPLAINT / HPI:  ?Chief Complaint  ?Patient presents with  ? New Patient (Initial Visit)  ?  ?Accompanied by mother. ? ?Patient reports intermittent abdominal discomfort and had a large amount of bright red blood with his stool a few weeks ago. He has been constipated, having daily bowel movements but reports hard stools.  He does not drink much water.  He has had hemorrhoids in the past but does not think he has any currently.  No pain with bowel movements. ? ?Patient reports he has had intermittent suicidal ideation for at least several months, but none in the past 2 weeks.  Denies any plan or intent.  He is open to therapy.  Denies any current stressors. ? ?Works at [employer] ?No regular medications. ?Neck cyst removal when he was 26 years old. Tonsillectomy at age 32 or 12. Wisdom teeth removal. No other surgeries. ?Single. Lives with mother and father. ?No regular exercise. ?Denies tobacco smoke but does use THC. Drinks alcohol only on weekends. Mom reports 1-2 shots on the weekend, he does drink at home. ?Agreeable to STI screening. ? ?PERTINENT  PMH / PSH: hemorrhoids ? ?Patient Care Team: ?Littie Deeds, MD as PCP - General (Family Medicine)  ? ?OBJECTIVE:  ? ?BP 122/84   Pulse 90   Ht 6' (1.829 m)   Wt 163 lb 6.4 oz (74.1 kg)   SpO2 99%   BMI 22.16 kg/m?   ?Physical Exam ?Constitutional:   ?   General: He is not in acute distress. ?Cardiovascular:  ?   Rate and Rhythm: Normal rate and regular rhythm.  ?Pulmonary:  ?   Effort: Pulmonary effort is normal. No respiratory distress.  ?   Breath sounds: Normal breath sounds.  ?Abdominal:  ?   General: Bowel sounds are normal.  ?   Palpations: Abdomen is soft.  ?   Tenderness: There is no abdominal tenderness.  ?Neurological:  ?   Mental Status: He is alert.  ?  ? ? ?  04/15/2022  ?  2:09 PM  ?Depression screen PHQ 2/9  ?Decreased Interest 2  ?Down, Depressed, Hopeless 2  ?PHQ - 2 Score 4  ?Altered sleeping 3   ?Tired, decreased energy 3  ?Change in appetite 3  ?Feeling bad or failure about yourself  3  ?Trouble concentrating 0  ?Moving slowly or fidgety/restless 0  ?Suicidal thoughts 2  ?PHQ-9 Score 18  ?  ? ?{Show previous vital signs (optional):23777} ? ? ? ?ASSESSMENT/PLAN:  ? ?Abdominal discomfort ?Reports abdominal discomfort and has had bloody BM in the setting of constipation and history of hemorrhoids. Suspect this could be possibly internal hemorrhoidal bleeding. Prior elevated bilirubin possibly Gilbert syndrome or secondary to alcohol use, will re-check today and obtain further workup at follow-up if indicated. ?- check CBC, CMP (most recent labs in the ED 1 year ago showed leukocytosis and elevated bilirubin) ?- discussed fiber intake, hydration, physical activity for constipation ?- trial Miralax 1-2x daily ?- f/u 2 weeks and evaluate further ? ?Depression ?PHQ-9 indicates possible depressive disorder with positive suicidal ideation.  No recent SI and patient denies intent or plan.  He has a good relationship with his parents and states that he would reach out if he is developing any active thoughts of suicide.  He is open to seeing a therapist. ?- therapy resources provided ?- suicide/crisis resources given ?- could consider antidepressant in the future ?  ?HCM ?- HIV screening ordered ?-  HCV screening ordered ?- Covid vaccine due, will discuss at follow-up ?- HPV vaccination due, will discuss at follow-up ?- Tdap, will discuss at follow-up ?- STI screening ordered - urine GC/chlamydia, RPR ? ?Return in about 2 weeks (around 04/29/2022) for f/u abdominal pain, constipation.  ? ?Littie Deeds, MD ?[facility]  ?

## 2022-04-15 ENCOUNTER — Other Ambulatory Visit (HOSPITAL_COMMUNITY)
Admission: RE | Admit: 2022-04-15 | Discharge: 2022-04-15 | Disposition: A | Discharge status: Home / Self Care | Payer: Self-pay | Source: Ambulatory Visit | Attending: Family Medicine | Admitting: Family Medicine

## 2022-04-15 ENCOUNTER — Encounter: Payer: Self-pay | Admitting: Family Medicine

## 2022-04-15 ENCOUNTER — Other Ambulatory Visit: Payer: Self-pay

## 2022-04-15 ENCOUNTER — Ambulatory Visit (INDEPENDENT_AMBULATORY_CARE_PROVIDER_SITE_OTHER): Payer: Self-pay | Admitting: Family Medicine

## 2022-04-15 VITALS — BP 122/84 | HR 90 | Ht 72.0 in | Wt 163.4 lb

## 2022-04-15 DIAGNOSIS — K5900 Constipation, unspecified: Secondary | ICD-10-CM

## 2022-04-15 DIAGNOSIS — F32A Depression, unspecified: Secondary | ICD-10-CM

## 2022-04-15 DIAGNOSIS — Z1159 Encounter for screening for other viral diseases: Secondary | ICD-10-CM

## 2022-04-15 DIAGNOSIS — R1084 Generalized abdominal pain: Secondary | ICD-10-CM

## 2022-04-15 DIAGNOSIS — Z114 Encounter for screening for human immunodeficiency virus [HIV]: Secondary | ICD-10-CM

## 2022-04-15 DIAGNOSIS — Z113 Encounter for screening for infections with a predominantly sexual mode of transmission: Secondary | ICD-10-CM | POA: Insufficient documentation

## 2022-04-15 DIAGNOSIS — Z7689 Persons encountering health services in other specified circumstances: Secondary | ICD-10-CM

## 2022-04-15 DIAGNOSIS — K921 Melena: Secondary | ICD-10-CM

## 2022-04-15 MED ORDER — POLYETHYLENE GLYCOL 3350 17 GM/SCOOP PO POWD: 17.0000 g | Freq: Every day | ORAL | 0 refills | Status: AC

## 2022-04-15 NOTE — Assessment & Plan Note (Signed)
PHQ-9 indicates possible depressive disorder with positive suicidal ideation.  No recent SI and patient denies intent or plan.  He has a good relationship with his parents and states that he would reach out if he is developing any active thoughts of suicide.  He is open to seeing a therapist. ?- therapy resources provided ?- suicide/crisis resources given ?- could consider antidepressant in the future ?

## 2022-04-16 LAB — URINE CYTOLOGY ANCILLARY ONLY
Chlamydia: NEGATIVE
Comment: NEGATIVE
Comment: NORMAL
Neisseria Gonorrhea: NEGATIVE

## 2022-04-17 LAB — COMPREHENSIVE METABOLIC PANEL
ALT: 13 IU/L (ref 0–44)
AST: 15 IU/L (ref 0–40)
Albumin/Globulin Ratio: 1 — ABNORMAL LOW (ref 1.2–2.2)
Albumin: 4.5 g/dL (ref 4.1–5.2)
Alkaline Phosphatase: 95 IU/L (ref 44–121)
BUN/Creatinine Ratio: 12 (ref 9–20)
BUN: 8 mg/dL (ref 6–20)
Bilirubin Total: 0.8 mg/dL (ref 0.0–1.2)
CO2: 23 mmol/L (ref 20–29)
Calcium: 9.8 mg/dL (ref 8.7–10.2)
Chloride: 101 mmol/L (ref 96–106)
Creatinine, Ser: 0.69 mg/dL — ABNORMAL LOW (ref 0.76–1.27)
Globulin, Total: 4.3 g/dL (ref 1.5–4.5)
Glucose: 86 mg/dL (ref 70–99)
Potassium: 4 mmol/L (ref 3.5–5.2)
Sodium: 137 mmol/L (ref 134–144)
Total Protein: 8.8 g/dL — ABNORMAL HIGH (ref 6.0–8.5)
eGFR: 131 mL/min/{1.73_m2} (ref >59)

## 2022-04-17 LAB — CBC
Hematocrit: 39.7 % (ref 37.5–51.0)
Hemoglobin: 13.9 g/dL (ref 13.0–17.7)
MCH: 30 pg (ref 26.6–33.0)
MCHC: 35 g/dL (ref 31.5–35.7)
MCV: 86 fL (ref 79–97)
Platelets: 249 10*3/uL (ref 150–450)
RBC: 4.63 x10E6/uL (ref 4.14–5.80)
RDW: 13.2 % (ref 11.6–15.4)
WBC: 4.4 10*3/uL (ref 3.4–10.8)

## 2022-04-17 LAB — HCV AB W REFLEX TO QUANT PCR: HCV Ab: NONREACTIVE

## 2022-04-17 LAB — RPR: RPR Ser Ql: REACTIVE — AB

## 2022-04-17 LAB — RPR, QUANT+TP ABS (REFLEX)
Rapid Plasma Reagin, Quant: 1:32 {titer} — ABNORMAL HIGH
T Pallidum Abs: REACTIVE — AB

## 2022-04-17 LAB — HIV ANTIBODY (ROUTINE TESTING W REFLEX): HIV Screen 4th Generation wRfx: NONREACTIVE

## 2022-04-17 LAB — HCV INTERPRETATION

## 2022-04-18 NOTE — Progress Notes (Signed)
    SUBJECTIVE:   CHIEF COMPLAINT / HPI:  No chief complaint on file.   Noted to have positive RPR and treponemal Ab on STI screening at establish care visit last week. Notably had negative RPR April 2022.  He denies any penile lesions or rash in the past year. He has had two sexual partners in the past year which are not new. He is not aware of any point in time when he may have contracted syphilis.  He reports constipation is improving with dietary changes.  He has not established with a therapist. He wants to work on his mood issues on his own. He denies any recent SI.  PERTINENT  PMH / PSH: depression  Patient Care Team: Littie Deeds, MD as PCP - General (Family Medicine)   OBJECTIVE:   BP 123/74   Pulse 79   Ht 6' (1.829 m)   Wt 164 lb (74.4 kg)   SpO2 97%   BMI 22.24 kg/m   Physical Exam Exam conducted with a chaperone present.  Constitutional:      General: He is not in acute distress. Cardiovascular:     Rate and Rhythm: Normal rate and regular rhythm.  Pulmonary:     Effort: Pulmonary effort is normal. No respiratory distress.     Breath sounds: Normal breath sounds.  Genitourinary:    Penis: Normal.      Comments: No penile lesions visualized. Bilateral inguinal LAD appreciated. Skin:    Findings: No rash.  Neurological:     Mental Status: He is alert.        04/22/2022    2:12 PM  Depression screen PHQ 2/9  Decreased Interest 1  Down, Depressed, Hopeless 1  PHQ - 2 Score 2  Altered sleeping 2  Tired, decreased energy 1  Change in appetite 2  Feeling bad or failure about yourself  2  Trouble concentrating 0  Moving slowly or fidgety/restless 0  Suicidal thoughts 1  PHQ-9 Score 10  Difficult doing work/chores Somewhat difficult     {Show previous vital signs (optional):23777}    ASSESSMENT/PLAN:   Syphilis, late latent Asymptomatic, unable to definitively say he acquired the infection within the past year so qualifies as late latent  syphilis. - penicillin G 2.4 million units weekly x 3 weeks, first dose given today - advised to notify partners for testing     Return in about 1 week (around 04/29/2022) for RN visit syphilis treatment.   Littie Deeds, MD [facility]

## 2022-04-18 NOTE — Patient Instructions (Addendum)
It was nice seeing you today!  You received a shot of penicillin today for syphilis treatment. You will need to receive 3 total treatments, once a week for the next 3 weeks.  Return as scheduled for your next dose.  Stay well, Joshua Deeds, MD [facility] [phone]  --  Make sure to check out at the front desk before you leave today.  Please arrive at least 15 minutes prior to your scheduled appointments.  If you had blood work today, I will send you a MyChart message or a letter if results are normal. Otherwise, I will give you a call.  If you had a referral placed, they will call you to set up an appointment. Please give [place] a call if you don't hear back in the next 2 weeks.  If you need additional refills before your next appointment, please call your pharmacy first.

## 2022-04-22 ENCOUNTER — Encounter: Payer: Self-pay | Admitting: Family Medicine

## 2022-04-22 ENCOUNTER — Ambulatory Visit (INDEPENDENT_AMBULATORY_CARE_PROVIDER_SITE_OTHER): Payer: Self-pay | Admitting: Family Medicine

## 2022-04-22 VITALS — BP 123/74 | HR 79 | Ht 72.0 in | Wt 164.0 lb

## 2022-04-22 DIAGNOSIS — A528 Late syphilis, latent: Secondary | ICD-10-CM

## 2022-04-22 MED ORDER — PENICILLIN G BENZATHINE 1200000 UNIT/2ML IM SUSY
2.4000 10*6.[IU] | PREFILLED_SYRINGE | INTRAMUSCULAR | Status: AC
  Administered 2022-04-22 – 2022-05-06 (×3): 2.4 10*6.[IU] via INTRAMUSCULAR

## 2022-04-22 NOTE — Assessment & Plan Note (Signed)
Asymptomatic, unable to definitively say he acquired the infection within the past year so qualifies as late latent syphilis. - penicillin G 2.4 million units weekly x 3 weeks, first dose given today - advised to notify partners for testing

## 2022-04-30 ENCOUNTER — Telehealth: Payer: Self-pay | Admitting: *Deleted

## 2022-04-30 ENCOUNTER — Ambulatory Visit (INDEPENDENT_AMBULATORY_CARE_PROVIDER_SITE_OTHER): Payer: Self-pay | Admitting: *Deleted

## 2022-04-30 DIAGNOSIS — A528 Late syphilis, latent: Secondary | ICD-10-CM

## 2022-04-30 NOTE — Telephone Encounter (Signed)
Opened in error. Lonza Shimabukuro, CMA  

## 2022-04-30 NOTE — Progress Notes (Signed)
    SUBJECTIVE:   CHIEF COMPLAINT / HPI:  Chief Complaint  Patient presents with   Follow-up    Completes 3rd and final dose of penicillin G today for treatment of late latent syphilis. He has informed his partners to get tested. He is wondering when he can get retested for syphilis.  Previous visit noted to have intermittent abdominal pain and had at least one episode of bright red blood in his stool. Counseled on constipation prevention and advised to trial Miralax. He has implemented these changes and has not had any further issues with constipation nor blood in stool. He does sometimes spend prolonged periods of time on the toilet which he is working on cutting down.  He denies any active SI or intent. He has had SI in the past but not in the past 2 weeks. He sometimes feels he would benefit from a therapist but sometimes feels he can handle it on his own. He is amenable to CCM referral to help him get set up with therapy.  PERTINENT  PMH / PSH: depression  Patient Care Team: Zola Button, MD as PCP - General (Family Medicine)   OBJECTIVE:   BP 121/71   Pulse 76   Ht 6' (1.829 m)   Wt 166 lb 6.4 oz (75.5 kg)   SpO2 100%   BMI 22.57 kg/m   Physical Exam Constitutional:      General: He is not in acute distress. Cardiovascular:     Rate and Rhythm: Normal rate and regular rhythm.  Pulmonary:     Effort: Pulmonary effort is normal. No respiratory distress.     Breath sounds: Normal breath sounds.  Neurological:     Mental Status: He is alert.  Psychiatric:     Comments: Affect appropriate        05/06/2022    2:16 PM  Depression screen PHQ 2/9  Decreased Interest 0  Down, Depressed, Hopeless 1  PHQ - 2 Score 1  Altered sleeping 2  Tired, decreased energy 1  Change in appetite 2  Feeling bad or failure about yourself  1  Trouble concentrating 0  Moving slowly or fidgety/restless 0  Suicidal thoughts 1  PHQ-9 Score 8  Difficult doing work/chores Somewhat  difficult     {Show previous vital signs (optional):23777}    ASSESSMENT/PLAN:   Syphilis, late latent Completes treatment today. Plan to retest at 6, 12, and 24 months. Advised that patient could come earlier at 3 months if he is concerned.  Depression No active SI or intent. Interested in therapy, CCM referral placed to assist him in getting established - he is amenable to assistance. Can consider medications in the future if interested.   Constipation - resolved  Return in about 6 months (around 11/05/2022) for syphilis follow-up.   Zola Button, MD [place]

## 2022-04-30 NOTE — Patient Instructions (Addendum)
It was nice seeing you today!  The recommendation is for you to be retested for syphilis at 6 months, 1 year, and 2 years after your treatment today. You can return earlier if you would like.  Referral to social work to help you get connected with therapy. They will be giving you a call.  Stay well, Joshua Deeds, MD [facility] [phone]  --  Make sure to check out at the front desk before you leave today.  Please arrive at least 15 minutes prior to your scheduled appointments.  If you had blood work today, I will send you a MyChart message or a letter if results are normal. Otherwise, I will give you a call.  If you had a referral placed, they will call you to set up an appointment. Please give [place] a call if you don't hear back in the next 2 weeks.  If you need additional refills before your next appointment, please call your pharmacy first.

## 2022-04-30 NOTE — Progress Notes (Signed)
Patient in nurse clinic today for STD treatment of Syphilis.   Patient advised to abstain from sex for 7-10 days after treatment of self and partner.    Bicillian 2.4 million units (1.2/94ml x 2 ) given.  Patient observed 15 minutes in office.  No reaction noted.   Patient to follow up next week with Dr. Wynelle Link for final dosage and OV.    Provided condoms and advised to use with all sexual activity. Patient verbalized understanding.   STD report form fax completed and faxed to [facility] Department at [phone] (STD department).     Jone Baseman, CMA

## 2022-05-01 ENCOUNTER — Telehealth: Payer: Self-pay

## 2022-05-01 NOTE — Telephone Encounter (Signed)
Shot documented. And STI sheet faxed by Shanda Bumps. Aquilla Solian, CMA

## 2022-05-01 NOTE — Telephone Encounter (Signed)
-----   Message from [place] Pink, [place] sent at 04/17/2022  2:54 PM EDT ----- Regarding: STI reporting POS RPR

## 2022-05-06 ENCOUNTER — Encounter: Payer: Self-pay | Admitting: Family Medicine

## 2022-05-06 ENCOUNTER — Encounter: Payer: Self-pay | Admitting: *Deleted

## 2022-05-06 ENCOUNTER — Ambulatory Visit (INDEPENDENT_AMBULATORY_CARE_PROVIDER_SITE_OTHER): Payer: Self-pay | Admitting: Family Medicine

## 2022-05-06 VITALS — BP 121/71 | HR 76 | Ht 72.0 in | Wt 166.4 lb

## 2022-05-06 DIAGNOSIS — A528 Late syphilis, latent: Secondary | ICD-10-CM

## 2022-05-06 DIAGNOSIS — K5900 Constipation, unspecified: Secondary | ICD-10-CM

## 2022-05-06 DIAGNOSIS — F32A Depression, unspecified: Secondary | ICD-10-CM

## 2022-05-06 NOTE — Assessment & Plan Note (Signed)
No active SI or intent. Interested in therapy, CCM referral placed to assist him in getting established - he is amenable to assistance. Can consider medications in the future if interested.

## 2022-05-06 NOTE — Assessment & Plan Note (Signed)
Completes treatment today. Plan to retest at 6, 12, and 24 months. Advised that patient could come earlier at 3 months if he is concerned.

## 2022-05-08 ENCOUNTER — Telehealth: Payer: Self-pay | Admitting: *Deleted

## 2022-05-08 NOTE — Chronic Care Management (AMB) (Signed)
  Care Management   Outreach Note  05/08/2022 Name: Joshua Holmes MRN: [id number] DOB: Oct 15, 1996  Referred by: Littie Deeds, MD Reason for referral : Care Coordination (Initial outreach to schedule referral with BSW )   An unsuccessful telephone outreach was attempted today. The patient was referred to the case management team for assistance with care management and care coordination.   Follow Up Plan:  The care management team will reach out to the patient again over the next 7 days.  If patient returns call to provider office, please advise to call Embedded Care Management Care Guide Misty Stanley* at [phone].Gwenevere Ghazi  Care Guide, Embedded Care Coordination [facility] Management  Direct Dial: [phone]

## 2022-05-15 NOTE — Chronic Care Management (AMB) (Unsigned)
  Care Management   Outreach Note  05/15/2022 Name: Joshua Holmes MRN: [id number] DOB: 06/16/1996  Referred by: Littie Deeds, MD Reason for referral : Care Coordination (Initial outreach to schedule referral with BSW )   A second unsuccessful telephone outreach was attempted today. The patient was referred to the case management team for assistance with care management and care coordination.   Follow Up Plan:  The care management team will reach out to the patient again over the next 7 days.  If patient returns call to provider office, please advise to call Embedded Care Management Care Guide Misty Stanley* at [phone].  Gwenevere Ghazi  Care Guide, Embedded Care Coordination [facility] Management  Direct Dial: [phone]

## 2022-05-19 ENCOUNTER — Encounter: Payer: Self-pay | Admitting: Family Medicine

## 2022-05-19 NOTE — Chronic Care Management (AMB) (Signed)
  Care Management   Outreach Note  05/19/2022 Name: Joshua Holmes MRN: [id number] DOB: May 31, 1996  Referred by: Littie Deeds, MD Reason for referral : Care Coordination (Initial outreach to schedule referral with BSW )   Third unsuccessful telephone outreach was attempted today. The patient was referred to the case management team for assistance with care management and care coordination. The patient's primary care provider has been notified of our unsuccessful attempts to make or maintain contact with the patient. The care management team is pleased to engage with this patient at any time in the future should he/she be interested in assistance from the care management team.   Follow Up Plan:  We have been unable to make contact with the patient. The care management team is available to follow up with the patient after provider conversation with the patient regarding recommendation for care management engagement and subsequent re-referral to the care management team.   [facility] Guide, Embedded Care Coordination [facility]  Care Management  Direct Dial: [phone]

## 2022-07-14 IMAGING — CT CT ABD-PELV W/ CM
2 of 4 series · 16 of 46 positions shown, 18 images · IV contrast (omnipaque)
Comparison: 06/30/2019

CLINICAL DATA: Right lower quadrant pain for 3 days.

EXAM:
CT ABDOMEN AND PELVIS WITH CONTRAST
TECHNIQUE: Multidetector CT imaging of the abdomen and pelvis was performed
using the standard protocol following bolus administration of
intravenous contrast.
CONTRAST:  75mL OMNIPAQUE IOHEXOL 300 MG/ML  SOLN

[Series 3: abd/ pelvis 5.0 i30f 2 · axial · 0.62mm/px · z∈[+801,+1246]mm · 13 of 97 slices shown, 15 images]
[im 4/97  soft-tissue]
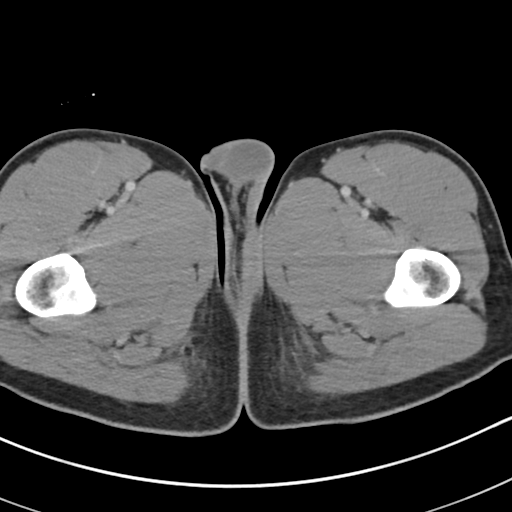
[im 4/97  bone]
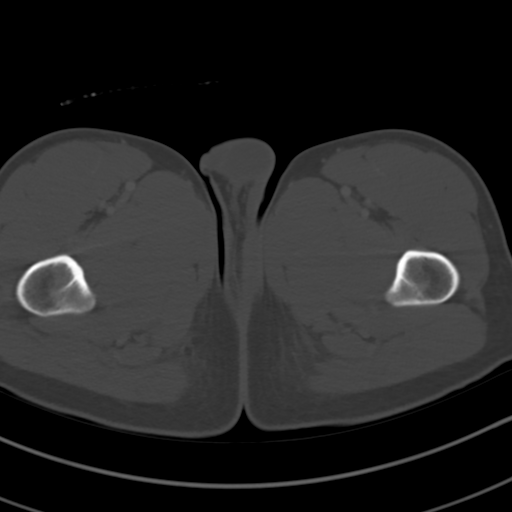
[im 12/97  soft-tissue]
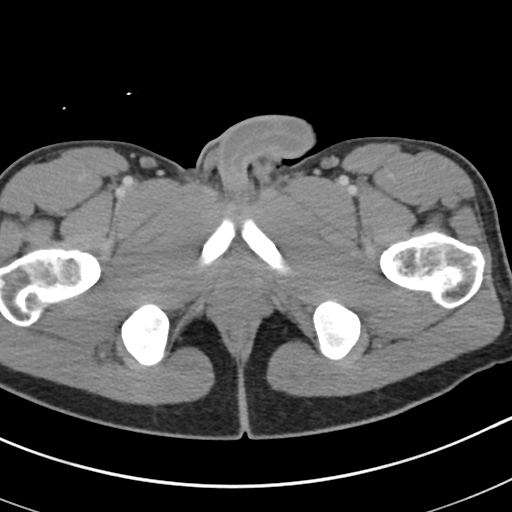
[im 20/97  soft-tissue]
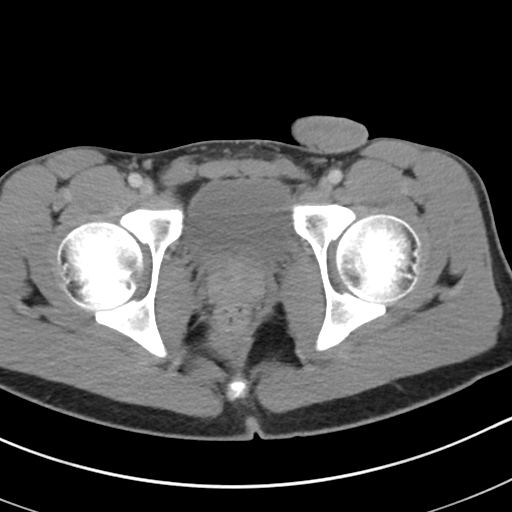
[im 27/97  soft-tissue]
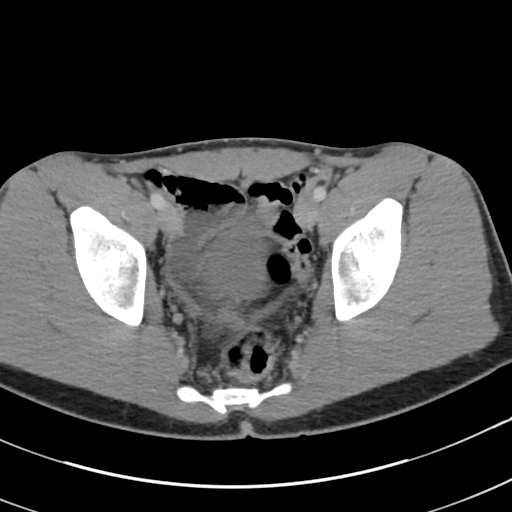
[im 35/97  soft-tissue]
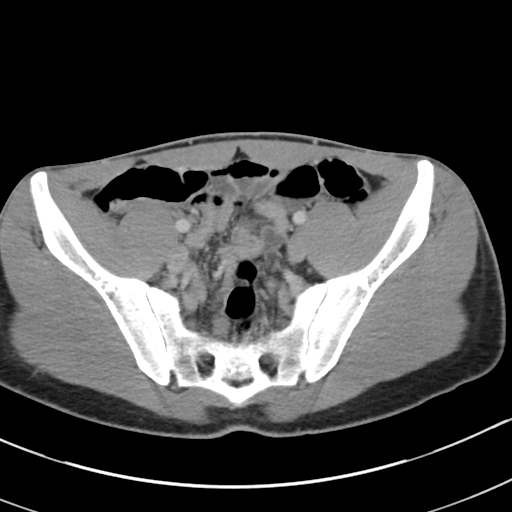
[im 43/97  soft-tissue]
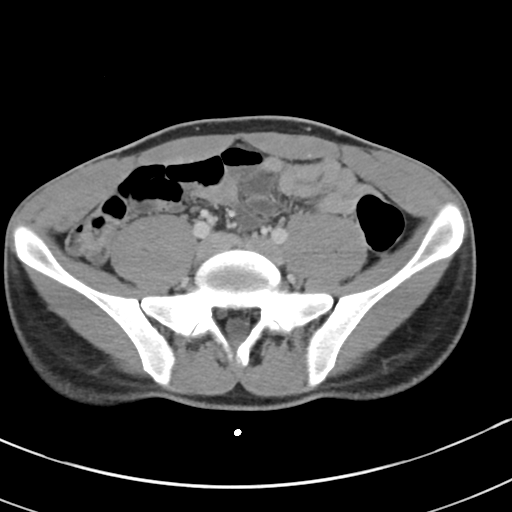
[im 50/97  soft-tissue]
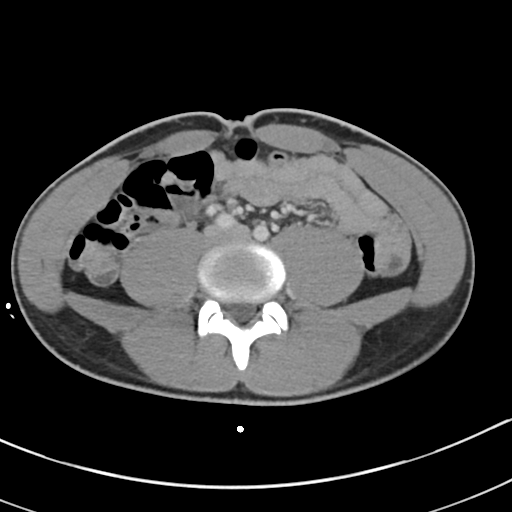
[im 54/97  soft-tissue]
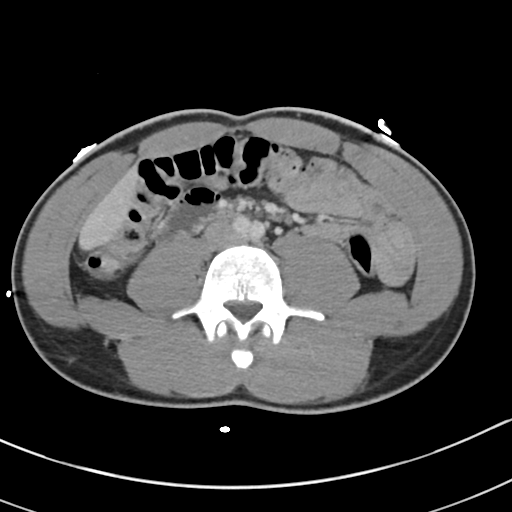
[im 62/97  soft-tissue]
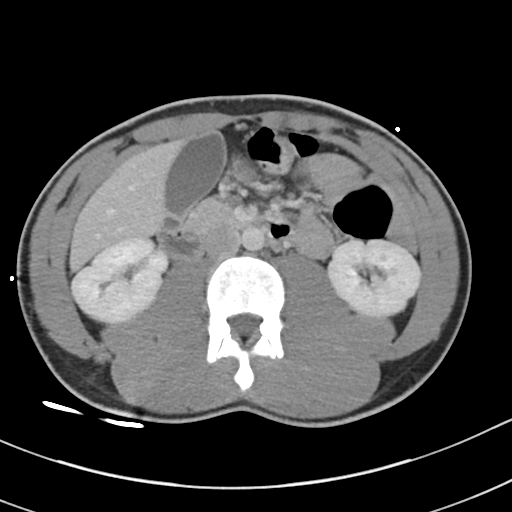
[im 62/97  bone]
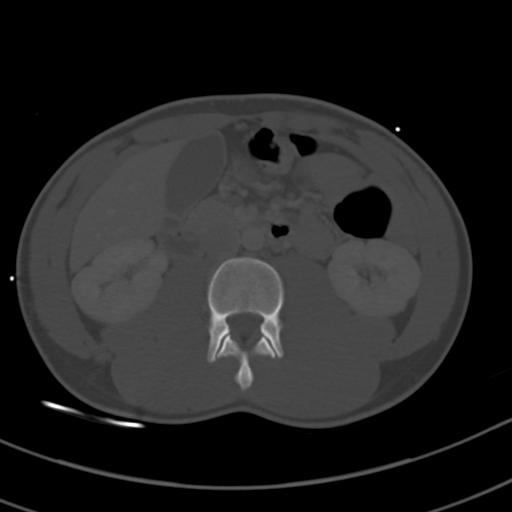
[im 70/97  soft-tissue]
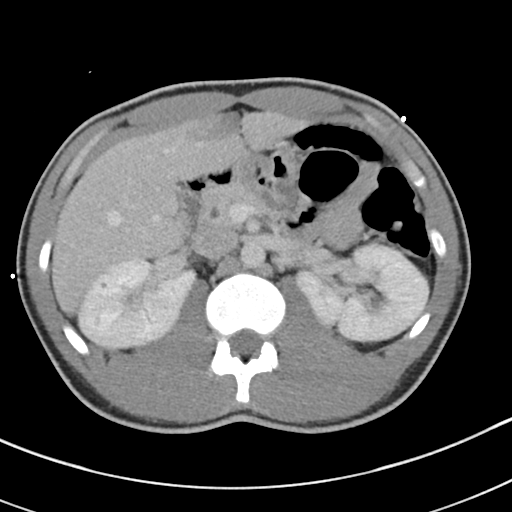
[im 77/97  soft-tissue]
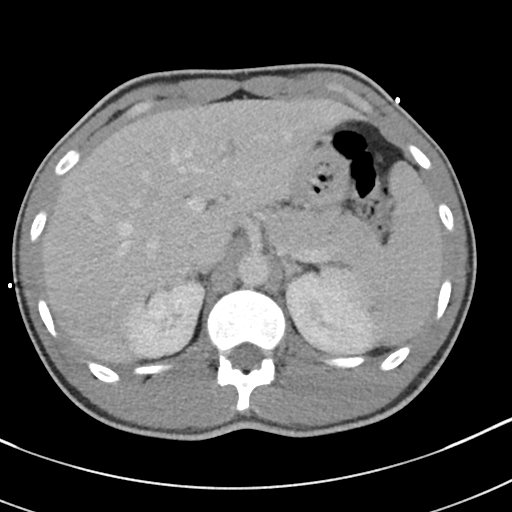
[im 85/97  soft-tissue]
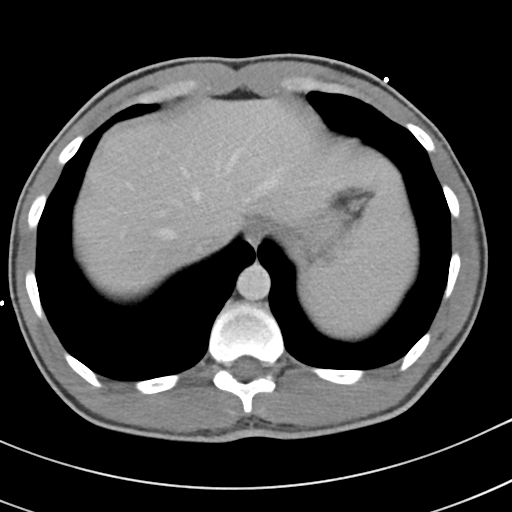
[im 93/97  soft-tissue]
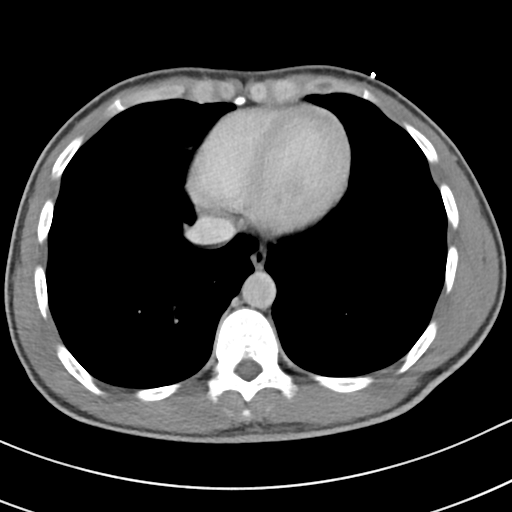

[Series 6: coronal soft tissue · coronal · 0.74mm/px · 3 of 95 slices shown]
[im 32/95  soft-tissue]
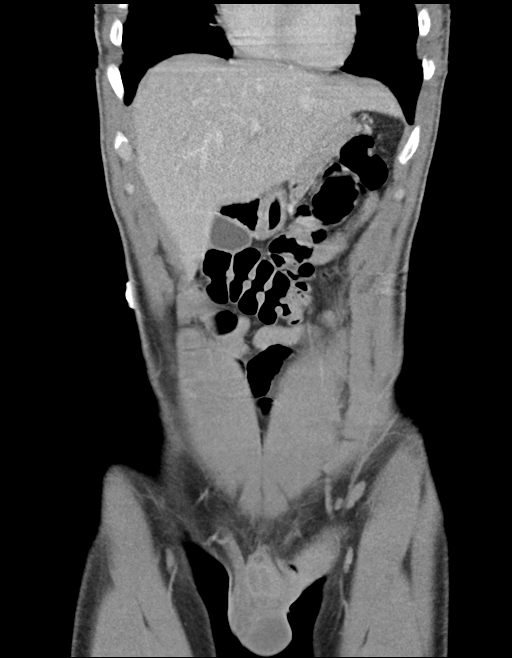
[im 42/95  soft-tissue]
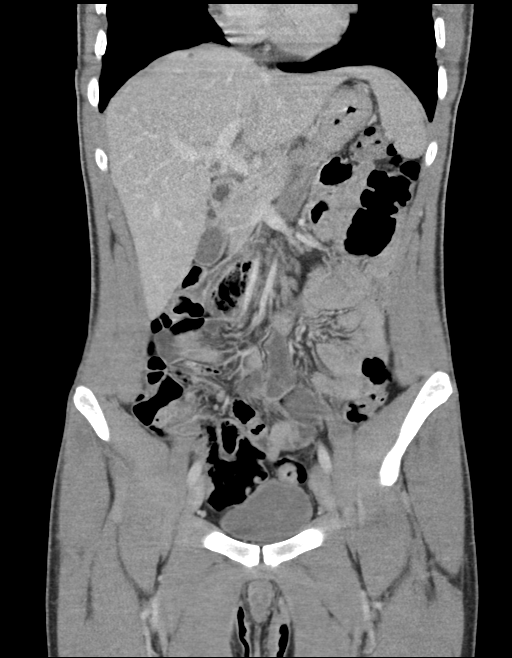
[im 53/95  soft-tissue]
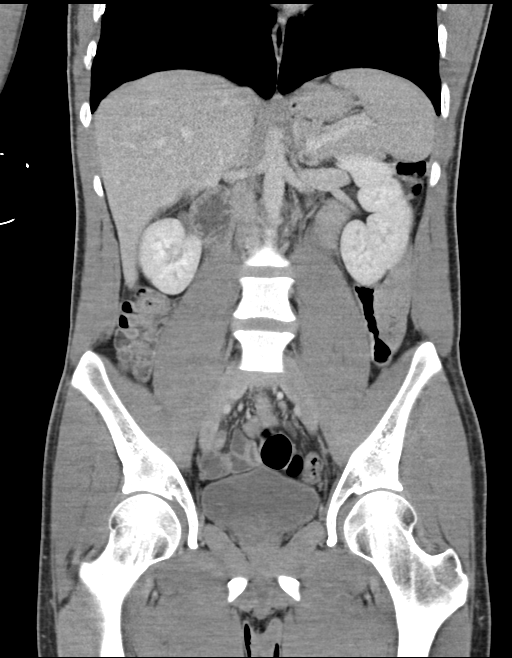

[16 of 46 positions shown; findings below may reference images not displayed]

FINDINGS: Lower chest: Clear lung bases. Normal heart size without pericardial
or pleural effusion.

Hepatobiliary: Focal steatosis adjacent the falciform ligament.
Normal gallbladder, without biliary ductal dilatation.

Pancreas: Normal, without mass or ductal dilatation.

Spleen: Normal in size, without focal abnormality.

Adrenals/Urinary Tract: Normal adrenal glands. Right renal
interpolar too small to characterize lesion. Normal left kidney. No
hydronephrosis. Normal urinary bladder.

Stomach/Bowel: Normal stomach, without wall thickening. Normal colon
and terminal ileum. Normal appendix, including on coronal images 49
through 54. Normal small bowel.

Vascular/Lymphatic: Normal caliber of the aorta and branch vessels.
No abdominopelvic adenopathy.

Reproductive: Normal prostate.

Other: No significant free fluid.  No free intraperitoneal air.

Musculoskeletal: No acute osseous abnormality.
IMPRESSION: No acute process or explanation for right lower quadrant pain.
Normal appendix.

## 2022-10-27 ENCOUNTER — Ambulatory Visit (INDEPENDENT_AMBULATORY_CARE_PROVIDER_SITE_OTHER): Payer: Self-pay | Admitting: Family Medicine

## 2022-10-27 VITALS — BP 118/64 | HR 93 | Ht 72.0 in | Wt 158.0 lb

## 2022-10-27 DIAGNOSIS — B349 Viral infection, unspecified: Secondary | ICD-10-CM

## 2022-10-27 DIAGNOSIS — A528 Late syphilis, latent: Secondary | ICD-10-CM

## 2022-10-27 MED ORDER — ONDANSETRON HCL 4 MG PO TABS: 4.0000 mg | ORAL_TABLET | Freq: Three times a day (TID) | ORAL | 0 refills | Status: AC | PRN

## 2022-10-27 NOTE — Patient Instructions (Addendum)
It was nice seeing you today!  Take Zofran every 8 hours as needed for nausea.  Blood work today.  Stay well, Joshua Deeds, MD [facility] [phone]  --  Make sure to check out at the front desk before you leave today.  Please arrive at least 15 minutes prior to your scheduled appointments.  If you had blood work today, I will send you a MyChart message or a letter if results are normal. Otherwise, I will give you a call.  If you had a referral placed, they will call you to set up an appointment. Please give [place] a call if you don't hear back in the next 2 weeks.  If you need additional refills before your next appointment, please call your pharmacy first.

## 2022-10-27 NOTE — Assessment & Plan Note (Signed)
Due for RPR check s/p treatment, will check today.

## 2022-10-27 NOTE — Progress Notes (Signed)
    SUBJECTIVE:   CHIEF COMPLAINT / HPI:  Chief Complaint  Patient presents with   Sore Throat    Patient reports he started feeling ill about 5 days ago with cold symptoms (sore throat, headache) and nausea that developed the following day. He did have chills initially but not in the past few days. He has had 1 episode of vomiting daily, still feeling nauseous. He has tried multiple OTC medications without relief. Sore throat still persists but is intermittent. Denies fever, diarrhea. Tolerating fluids OK, just difficult eat. No sick contacts.  PERTINENT  PMH / PSH: Late latent syphilis treated 05/2022, depression  Patient Care Team: Littie Deeds, MD as PCP - General (Family Medicine)   OBJECTIVE:   BP 118/64   Pulse 93   Ht 6' (1.829 m)   Wt 158 lb (71.7 kg)   SpO2 99%   BMI 21.43 kg/m   Physical Exam Constitutional:      General: He is not in acute distress. HENT:     Mouth/Throat:     Mouth: Mucous membranes are moist.     Comments: Few scattered aphthous ulcers noted on the soft palate. Cardiovascular:     Rate and Rhythm: Normal rate and regular rhythm.  Pulmonary:     Effort: Pulmonary effort is normal. No respiratory distress.     Breath sounds: Normal breath sounds.  Neurological:     Mental Status: He is alert.         10/27/2022    3:37 PM  Depression screen PHQ 2/9  Decreased Interest 0  Down, Depressed, Hopeless 0  PHQ - 2 Score 0  Altered sleeping 2  Tired, decreased energy 1  Change in appetite 1  Feeling bad or failure about yourself  0  Trouble concentrating 0  Moving slowly or fidgety/restless 0  Suicidal thoughts 0  PHQ-9 Score 4  Difficult doing work/chores Not difficult at all     {Show previous vital signs (optional):23777}    ASSESSMENT/PLAN:   Viral illness Patient presenting with viral prodrome of symptoms with nausea and vomiting. Do not suspect bacterial cause. - supportive care - ondansetron prn for nausea  Syphilis,  late latent Due for RPR check s/p treatment, will check today.    Return in about 7 months (around 05/28/2023) for physical.   Littie Deeds, MD [facility] [place]

## 2022-10-29 ENCOUNTER — Encounter: Payer: Self-pay | Admitting: Family Medicine

## 2022-10-29 ENCOUNTER — Ambulatory Visit (INDEPENDENT_AMBULATORY_CARE_PROVIDER_SITE_OTHER): Payer: Self-pay | Admitting: Family Medicine

## 2022-10-29 VITALS — BP 104/62 | HR 54 | Ht 72.0 in | Wt 157.6 lb

## 2022-10-29 DIAGNOSIS — J020 Streptococcal pharyngitis: Secondary | ICD-10-CM

## 2022-10-29 LAB — RPR, QUANT+TP ABS (REFLEX)
Rapid Plasma Reagin, Quant: 1:8 {titer} — ABNORMAL HIGH
T Pallidum Abs: REACTIVE — AB

## 2022-10-29 LAB — RPR: RPR Ser Ql: REACTIVE — AB

## 2022-10-29 MED ORDER — PENICILLIN V POTASSIUM 500 MG PO TABS: 500.0000 mg | ORAL_TABLET | Freq: Three times a day (TID) | ORAL | 0 refills | Status: AC

## 2022-10-29 NOTE — Patient Instructions (Signed)
Your Rx has been sent in. Take ALL of it even if you start to feel better. Use OTC ibuprofen for pain in the next few days. You should be totally better by the time you finish the antibiotics. If not, please call or make another appointment.

## 2022-10-29 NOTE — Progress Notes (Signed)
    CHIEF COMPLAINT / HPI: 67 days ago started with cold-like symptoms including sore throat, headache, nasal congestion.  Had a fever for 1 day.  Symptoms have mostly resolved except for the sore throat which remains quite severe.   PERTINENT  PMH / PSH: I have reviewed the patient's medications, allergies, past medical and surgical history, smoking status and updated in the EMR as appropriate. Latent syphlis treated with IM PCN x 3 (observed)  OBJECTIVE:  BP 104/62   Pulse (!) 54   Ht 6' (1.829 m)   Wt 157 lb 9.6 oz (71.5 kg)   SpO2 99%   BMI 21.37 kg/m  GENERAL: Well-developed male no acute distress HEENT: Neck some mild shotty lymphadenopathy in the anterior cervical chain left greater than right.  Oropharynx reveals a bright red throat with thick white patches around the tonsillar area.  Tonsil area is mildly swollen.  Airway is still quite open however.  ASSESSMENT / PLAN:  #1.  Clinically this is strep pharyngitis: Will treat with penicillin.  Mask if he is with immunocompromised people.  Do not share drinks.  Over-the-counter NSAIDs for pain relief over the next 2 days.  Call or return if not totally resolved in 10 days or if he has new or worsening symptoms. No problem-specific Assessment & Plan notes found for this encounter.   Denny Levy MD

## 2022-12-12 ENCOUNTER — Ambulatory Visit (INDEPENDENT_AMBULATORY_CARE_PROVIDER_SITE_OTHER): Payer: Self-pay | Admitting: Family Medicine

## 2022-12-12 ENCOUNTER — Encounter: Payer: Self-pay | Admitting: Family Medicine

## 2022-12-12 VITALS — BP 120/64 | HR 98 | Ht 72.0 in | Wt 166.0 lb

## 2022-12-12 DIAGNOSIS — J101 Influenza due to other identified influenza virus with other respiratory manifestations: Secondary | ICD-10-CM

## 2022-12-12 DIAGNOSIS — R059 Cough, unspecified: Secondary | ICD-10-CM

## 2022-12-12 LAB — POC SOFIA 2 FLU + SARS ANTIGEN FIA
Influenza A, POC: NEGATIVE
Influenza B, POC: POSITIVE — AB
SARS Coronavirus 2 Ag: NEGATIVE

## 2022-12-12 MED ORDER — OSELTAMIVIR PHOSPHATE 75 MG PO CAPS: 75.0000 mg | ORAL_CAPSULE | Freq: Two times a day (BID) | ORAL | 0 refills | Status: AC

## 2022-12-12 MED ORDER — BENZONATATE 100 MG PO CAPS: 100.0000 mg | ORAL_CAPSULE | Freq: Two times a day (BID) | ORAL | 0 refills | Status: AC | PRN

## 2022-12-12 NOTE — Assessment & Plan Note (Signed)
Viral URI symptoms, positive for influenza B in clinic on rapid swab. Rx Tamiflu, tessalon pearls. Has zofran at home. Appears well hydrated. No red flags. Return precautions given, see AVS for all.

## 2022-12-12 NOTE — Progress Notes (Signed)
    SUBJECTIVE:   CHIEF COMPLAINT / HPI:   URI symptoms Since Tuesday night Symptoms: cough, congestion, chest pain, n/v, eye redness, subjective fever, sweats, decreased appetite Denies: ear pain, blurry vision, diarrhea, rashes, swollen joints Tried Mucinex without relief Drinking fluids okay, normal UOP No known sick contacts at home or work  PERTINENT  PMH / PSH:  Patient Active Problem List   Diagnosis Date Noted   Influenza B 12/12/2022   Syphilis, late latent 04/22/2022   Depression 04/15/2022    OBJECTIVE:   BP 120/64   Pulse 98   Ht 6' (1.829 m)   Wt 166 lb (75.3 kg)   SpO2 98%   BMI 22.51 kg/m    PHQ-9:     10/29/2022    9:01 AM 10/27/2022    3:37 PM 05/06/2022    2:16 PM  Depression screen PHQ 2/9  Decreased Interest 0 0 0  Down, Depressed, Hopeless 0 0 1  PHQ - 2 Score 0 0 1  Altered sleeping 2 2 2   Tired, decreased energy 2 1 1   Change in appetite 2 1 2   Feeling bad or failure about yourself  0 0 1  Trouble concentrating 0 0 0  Moving slowly or fidgety/restless 0 0 0  Suicidal thoughts 0 0 1  PHQ-9 Score 6 4 8   Difficult doing work/chores  Not difficult at all Somewhat difficult    Physical Exam General: Awake, alert, oriented HEENT: PERRL, bilateral TM pearly pink and flat, bilateral external auditory canals with minimal cerumen burden, no lesions, nasal mucosa slightly edematous, oral mucosa pink, moist, without lesion, intact dentition without obvious cavity Lymph: No palpable lymphedema of head or neck Cardiovascular: Regular rate and rhythm, S1 and S2 present, no murmurs auscultated Respiratory: Lung fields clear to auscultation bilaterally  ASSESSMENT/PLAN:   Influenza B Viral URI symptoms, positive for influenza B in clinic on rapid swab. Rx Tamiflu, tessalon pearls. Has zofran at home. Appears well hydrated. No red flags. Return precautions given, see AVS for all.    Ezequiel Essex, MD [place]

## 2022-12-12 NOTE — Patient Instructions (Signed)
It was wonderful to see you today. Thank you for allowing me to be a part of your care. Below is a short summary of what we discussed at your visit today:  Influenza You have the influenza B virus.  Please take Tamiflu twice daily for 5 days.  You may use your Zofran for nausea treatment.  Tessalon pearls to help with cough.  Focus on hydration and rest.   Fluids: make sure your child drinks enough water or Pedialyte; for older kids Gatorade is okay too. Signs of dehydration are not making tears or urinating less than once every 8-10 hours.  Treatment:  - give 1 tablespoon of honey 3-4 times a day.  - You can also mix honey and lemon in chamomille or peppermint tea.  - You can use nasal saline to loosen nose mucus - Place a humidifier next to your bed while you sleep - If someone is taking a hot shower, in the bathroom to breathe in the steam - research studies show that honey works better than cough medicine.  Timeline:  - fever, runny nose, and fussiness get worse up to day 4 or 5, but then get better - it can take 2-3 weeks for cough to completely go away - cough may take weeks to resolve  Reasons to return for care include if: - is having trouble eating  - is acting very sleepy and not waking up to eat - is having trouble breathing or turns blue - is dehydrated (stops making tears or has less than 1 wet diaper every 8-10 hours)  If you have any questions or concerns, please do not hesitate to contact us via phone or MyChart message.   Ezequiel Essex, MD

## 2023-01-23 ENCOUNTER — Other Ambulatory Visit: Payer: Self-pay

## 2023-01-23 ENCOUNTER — Encounter (HOSPITAL_COMMUNITY): Payer: Self-pay

## 2023-01-23 ENCOUNTER — Emergency Department (HOSPITAL_COMMUNITY)
Admission: EM | Admit: 2023-01-23 | Discharge: 2023-01-23 | Disposition: A | Discharge status: Home / Self Care | Payer: No Typology Code available for payment source | Attending: Emergency Medicine | Admitting: Emergency Medicine

## 2023-01-23 ENCOUNTER — Emergency Department (HOSPITAL_COMMUNITY): Payer: No Typology Code available for payment source

## 2023-01-23 DIAGNOSIS — S46911A Strain of unspecified muscle, fascia and tendon at shoulder and upper arm level, right arm, initial encounter: Secondary | ICD-10-CM

## 2023-01-23 DIAGNOSIS — S40011A Contusion of right shoulder, initial encounter: Secondary | ICD-10-CM

## 2023-01-23 DIAGNOSIS — S46011A Strain of muscle(s) and tendon(s) of the rotator cuff of right shoulder, initial encounter: Secondary | ICD-10-CM | POA: Diagnosis not present

## 2023-01-23 DIAGNOSIS — M25511 Pain in right shoulder: Secondary | ICD-10-CM | POA: Diagnosis present

## 2023-01-23 DIAGNOSIS — Y92410 Unspecified street and highway as the place of occurrence of the external cause: Secondary | ICD-10-CM | POA: Diagnosis not present

## 2023-01-23 MED ORDER — LIDOCAINE 5 % EX PTCH
1.0000 | MEDICATED_PATCH | CUTANEOUS | Status: DC
  Administered 2023-01-23: 1 via TRANSDERMAL
  Filled 2023-01-23: qty 1

## 2023-01-23 MED ORDER — METHOCARBAMOL 500 MG PO TABS: 500.0000 mg | ORAL_TABLET | Freq: Two times a day (BID) | ORAL | 0 refills | Status: AC

## 2023-01-23 MED ORDER — NAPROXEN 250 MG PO TABS
500.0000 mg | ORAL_TABLET | Freq: Once | ORAL | Status: AC
  Administered 2023-01-23: 500 mg via ORAL
  Filled 2023-01-23: qty 2

## 2023-01-23 MED ORDER — NAPROXEN 500 MG PO TABS: 500.0000 mg | ORAL_TABLET | Freq: Two times a day (BID) | ORAL | 0 refills | Status: AC

## 2023-01-23 MED ORDER — ACETAMINOPHEN 325 MG PO TABS
650.0000 mg | ORAL_TABLET | Freq: Once | ORAL | Status: AC
  Administered 2023-01-23: 650 mg via ORAL
  Filled 2023-01-23: qty 2

## 2023-01-23 MED ORDER — ACETAMINOPHEN 500 MG PO TABS: 500.0000 mg | ORAL_TABLET | Freq: Four times a day (QID) | ORAL | 0 refills | Status: AC | PRN
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
## 2023-01-23 NOTE — Discharge Instructions (Addendum)
You were seen in the ER for shoulder pain.  The x-ray is negative for any fracture or dislocation.  It appears that you likely have a bruise in your shoulder or shoulder sprain.  RICE therapy recommended. Call the orthopedic surgeon for the follow-up appointment in 1 week.

## 2023-07-21 ENCOUNTER — Ambulatory Visit (INDEPENDENT_AMBULATORY_CARE_PROVIDER_SITE_OTHER): Payer: Self-pay | Admitting: Student

## 2023-07-21 VITALS — BP 129/88 | HR 80 | Temp 97.7°F | Ht 72.0 in | Wt 154.4 lb

## 2023-07-21 DIAGNOSIS — K529 Noninfective gastroenteritis and colitis, unspecified: Secondary | ICD-10-CM

## 2023-07-21 NOTE — Progress Notes (Signed)
    SUBJECTIVE:   CHIEF COMPLAINT / HPI:   27 year old male presenting with abdominal pain.  He said this started about 3 to 4 days ago with associated symptom of watery diarrhea and nausea throughout the weekend.  Describes the pain as a crampy abdominal pain generalized over the abdomen. No bloody stool and unsure of any sick contact.  Abdominal l pain has since improved but still present.  Has not had any unusual diet and no notable change in medication recently.  Denies any fever, chills, or headaches   PERTINENT  PMH / PSH: Reviewed  OBJECTIVE:   BP 129/88   Pulse 80   Temp 97.7 F (36.5 C)   Ht 6' (1.829 m)   Wt 70 kg   SpO2 100%   BMI 20.94 kg/m    Physical Exam General: Alert, well appearing, NAD Cardiovascular: RRR, No Murmurs, Normal S2/S2 Respiratory: CTAB, No wheezing or Rales Abdomen:  No distention. Mild generalized tenderness palpation without rebound or guarding, soft,   ASSESSMENT/PLAN:   Gastroenteritis Patient symptom of abdominal pain with nausea and watery diarrhea is more consistent with possible viral gastroenteritis vs food poisoning.  No red flag signs and symptoms seems to be improving since onset.  Provided reassurance and discussed conservative management. -Encourage adequate hydration -Recommend Metamucil for diarrhea -Ibuprofen or Tylenol for pain or fever.  Jerre Simon, MD [facility]

## 2023-07-21 NOTE — Patient Instructions (Signed)
It was wonderful to meet you today. Thank you for allowing me to be a part of your care. Below is a short summary of what we discussed at your visit today:  I suspect your abdominal pain and diarrhea is most likely due to a viral gastroenteritis.  I recommend making sure that you drink enough water and you can also use Gatorade to stay hydrated.  I also recommend use of over-the-counter Metamucil daily at least for the next 5 days.  You can do Tylenol or ibuprofen to help relieve your abdominal pain.  If you have any questions or concerns, please do not hesitate to contact us via phone or MyChart message.   Jerre Simon, MD [facility]
